# Patient Record
Sex: Female | Born: 1960
Health system: Southern US, Community
[De-identification: ages and names within clinical notes are randomized; demographics above are authoritative.]

## PROBLEM LIST (undated history)

## (undated) DIAGNOSIS — IMO0001 Reserved for inherently not codable concepts without codable children: Secondary | ICD-10-CM

## (undated) DIAGNOSIS — M797 Fibromyalgia: Secondary | ICD-10-CM

## (undated) DIAGNOSIS — Z95810 Presence of automatic (implantable) cardiac defibrillator: Secondary | ICD-10-CM

## (undated) DIAGNOSIS — K449 Diaphragmatic hernia without obstruction or gangrene: Secondary | ICD-10-CM

## (undated) DIAGNOSIS — I10 Essential (primary) hypertension: Secondary | ICD-10-CM

## (undated) DIAGNOSIS — L98499 Non-pressure chronic ulcer of skin of other sites with unspecified severity: Secondary | ICD-10-CM

## (undated) DIAGNOSIS — Z8619 Personal history of other infectious and parasitic diseases: Secondary | ICD-10-CM

## (undated) DIAGNOSIS — Z950 Presence of cardiac pacemaker: Secondary | ICD-10-CM

## (undated) DIAGNOSIS — F410 Panic disorder [episodic paroxysmal anxiety] without agoraphobia: Secondary | ICD-10-CM

## (undated) HISTORY — PX: KNEE ARTHROSCOPY: SUR90

## (undated) HISTORY — DX: Diaphragmatic hernia without obstruction or gangrene: K44.9

## (undated) HISTORY — DX: Personal history of other infectious and parasitic diseases: Z86.19

## (undated) HISTORY — DX: Essential (primary) hypertension: I10

## (undated) HISTORY — DX: Fibromyalgia: M79.7

## (undated) HISTORY — DX: Reserved for inherently not codable concepts without codable children: IMO0001

## (undated) HISTORY — DX: Presence of automatic (implantable) cardiac defibrillator: Z95.810

## (undated) HISTORY — DX: Panic disorder (episodic paroxysmal anxiety): F41.0

## (undated) HISTORY — DX: Presence of cardiac pacemaker: Z95.0

## (undated) HISTORY — DX: Non-pressure chronic ulcer of skin of other sites with unspecified severity: L98.499

---

## 1995-02-04 HISTORY — PX: TUBAL LIGATION: SHX77

## 1995-02-04 HISTORY — PX: CHOLECYSTECTOMY: SHX55

## 2000-06-28 ENCOUNTER — Emergency Department (HOSPITAL_COMMUNITY): Admission: EM | Admit: 2000-06-28 | Discharge: 2000-06-28 | Payer: Self-pay | Admitting: Emergency Medicine

## 2000-06-28 ENCOUNTER — Encounter: Payer: Self-pay | Admitting: Emergency Medicine

## 2000-07-10 ENCOUNTER — Emergency Department (HOSPITAL_COMMUNITY): Admission: EM | Admit: 2000-07-10 | Discharge: 2000-07-10 | Payer: Self-pay | Admitting: Internal Medicine

## 2000-07-10 ENCOUNTER — Encounter: Payer: Self-pay | Admitting: *Deleted

## 2006-04-14 ENCOUNTER — Ambulatory Visit (HOSPITAL_COMMUNITY): Admission: RE | Admit: 2006-04-14 | Discharge: 2006-04-14 | Payer: Self-pay | Admitting: Orthopaedic Surgery

## 2006-04-14 ENCOUNTER — Encounter (INDEPENDENT_AMBULATORY_CARE_PROVIDER_SITE_OTHER): Payer: Self-pay | Admitting: Specialist

## 2007-01-07 ENCOUNTER — Ambulatory Visit: Payer: Self-pay | Admitting: Orthopedic Surgery

## 2007-01-07 DIAGNOSIS — Z8679 Personal history of other diseases of the circulatory system: Secondary | ICD-10-CM | POA: Insufficient documentation

## 2007-01-07 DIAGNOSIS — M7660 Achilles tendinitis, unspecified leg: Secondary | ICD-10-CM | POA: Insufficient documentation

## 2007-01-08 ENCOUNTER — Telehealth: Payer: Self-pay | Admitting: Orthopedic Surgery

## 2007-04-12 ENCOUNTER — Ambulatory Visit (HOSPITAL_COMMUNITY): Admission: RE | Admit: 2007-04-12 | Discharge: 2007-04-12 | Payer: Self-pay | Admitting: Family Medicine

## 2007-04-22 ENCOUNTER — Telehealth: Payer: Self-pay | Admitting: Orthopedic Surgery

## 2010-06-21 NOTE — H&P (Signed)
April Robles, April Robles                ACCOUNT NO.:  [id number]   MEDICAL RECORD NO.:  [id number]          PATIENT TYPE:  AMB   LOCATION:  DAY                           FACILITY:  [facility]   PHYSICIAN:  J. Darreld Mclean, M.D. DATE OF BIRTH:  10-28-1960   DATE OF ADMISSION:  DATE OF DISCHARGE:  LH                              HISTORY & PHYSICAL   CHIEF COMPLAINT:  Right knee pain.   Patient seen by me the first time in the office on February 18, 2006.  She got hurt on the job on January 23, 2006 with a right knee injury.  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  There was a rug in front of the door.  She did  not realize it was there.  She got her foot caught on the rug, slipped,  fell and twisted, landed on her right knee.  She was seen at [facility] emergency room on the December 21st, the day of the injury.  She was told she had degenerative joint disease of the knee.  The knee  kept bothering her, kept swelling, kept giving way, kept having pain.  I  was concerned about a meniscal injury and arranged a MRI of her knee  done at [facility] imaging, on the 21st of January.  The MRI showed  synovial osteochondromatosis and a medial meniscal tear with effusion.  The lateral meniscus and ACL and MCL and the LCL were intact.  I  informed the patient of the findings on the 23rd of January.  She was  having less pain, because I injected her knee on the original date that  I saw her.  She wanted to hold off any surgery.  She began having more  pain in the knee, especially with weather changes on February 6th, and I  recommended surgery of the knee.  We discussed this on the 25th of  February, and she agreed to undergo the procedure for tomorrow, the 11th  of March.  Risks and imponderables of the procedure were discussed.  The  patient expressed understanding, agrees to procedure as outlined.   PAST MEDICAL HISTORY:  Patient has  history of hypertension and ulcer  disease.  She denies heart disease, lung disease, kidney disease,  stroke, paralysis, weakness, diabetes, cancer, circulatory problems.   ALLERGIES:  THE PATIENT HAS NO ALLERGIES.   CURRENT MEDICATIONS:  1. Prilosec OTC 20 mg daily.  2. Alprazolam 0.5 mg, 1/2 to 1 every 8 hours as needed.  3. Hydrochlorothiazide 25 mg daily.  4. Enalapril 10 mg daily.  5. Histinex daily.  6. Darvocet N-100.   SOCIAL HISTORY:  She smokes a pack a week, does not use alcoholic  beverages.  [facility] is her family doctor.   PAST SURGICAL HISTORY:  She is status post D&C in 1980, left knee  surgery 2001, bilateral tubal ligation 1997, gallbladder 1997.  She  denies any diseases that run in the family, as well as an on the job  injury.  She is single  and lives in [place].   PHYSICAL EXAMINATION:  VITAL SIGNS:  BP is 140/88, pulse 80, respiratory  rate 16, afebrile, 5 foot 10-1/4 inches, 261 pounds.  GENERAL:  She is alert, cooperative, oriented.  HEENT:  Negative.  NECK:  Supple.  LUNGS:  Clear to P&A.  HEART:  Regular rate and rhythm without murmur heard.  ABDOMEN:  Soft, tender without masses.  EXTREMITIES:  She has got a positive effusion of the right knee.  She  has got a positive McMurray's medially and pain and tenderness in the  right knee.  Range of motion of right knee is 0-100 and is stable.  Other extremities are within normal limits.  CNS:  Intact.  SKIN:  Intact.   IMPRESSION:  Tear, medial meniscus of the right knee.   PLAN:  Operative arthroscopy of the right knee, lab pending.                                            ______________________________  J. Darreld Mclean, M.D.     JWK/MEDQ  D:  04/13/2006  T:  04/13/2006  Job:  [id number]   cc:   Teola Bradley, M.D.  Fax: [phone]

## 2010-06-21 NOTE — Op Note (Signed)
NAMEZADIA, UHDE                ACCOUNT NO.:  [id number]   MEDICAL RECORD NO.:  [id number]          PATIENT TYPE:  AMB   LOCATION:  DAY                           FACILITY:  [facility]   PHYSICIAN:  J. Darreld Mclean, M.D. DATE OF BIRTH:  [birth year]   DATE OF PROCEDURE:  DATE OF DISCHARGE:                               OPERATIVE REPORT   PREOPERATIVE DIAGNOSIS:  Torn medial meniscus, right knee.   POSTOPERATIVE DIAGNOSIS:  Torn medial meniscus, right knee.   OPERATION PERFORMED:  Operative fluoroscopy to the right knee, partial  medial meniscectomy.   SURGEON:  J. Darreld Mclean, M.D.   TOURNIQUET TIME:  24 minutes.   DRAINS:  None.   ANESTHESIA:   INDICATIONS FOR PROCEDURE:  The patient is a 50 year old female  complaining of tenderness in her right knee.  She hurt it on the job.  She had pain and tenderness in her knee and getting progressively worse.  MRI shows tear of the medial meniscus.  She has not improved with  conservative treatment.  Surgery is recommended.  Risks and  imponderables of the procedure were discussed preoperatively and she  appeared to understand and agreed to procedure as outlined.   DESCRIPTION OF PROCEDURE:  The patient was seen in the holding area.  The right knee was identified as the correct surgical site.  She placed  a marker on the right knee.  I placed a marker on the right knee.  I  talked to the family.  The patient then brought back to the operating  room.  She was placed supine on the operating room table and given  general anesthetic.  With the tourniquet deflated and a leg holder  applied.  She was prepped and draped in usual manner.  We had a time  out, identifying Ms. Caudle as the patient and the right knee as  correct surgical site.   Leg wrapped circumferentially with Esmarch bandage, tourniquet inflated  to 300 mmHg.  Esmarch bandage removed.  Inflow cannula inserted  medially, lactated ringers instilled in the knee by an  infusion pump.  Knee systematically examined.   FINDINGS:  Suprapatellar pouch had synovitis.  There was a small spur  off the lateral side of the patella.  There were some grade 2 changes of  the patella.  Medially there were grade 3 changes and one area of grade  4 changes in the distal femoral condyle and grade 3 changes in the  tibial plateau.  She has a tear on the posterior horn of the meniscus.  This was stellate type tear.  Anterior cruciate ligament was intact  laterally, looked very good.  There were grade 2 changes.  The meniscus  looked normal.  Permanent pictures taken throughout the procedure.  Attention was directed back to the medial side and using a meniscal  shaver, a punch and then a holmium laser, a good smooth contour was  obtained.  Knee was systematically examined and no pathology found.  Wound was irrigated with remaining part of lactated Ringers.  Wound was  reapproximated using 3-0 nylon interrupted vertical mattress  manner.  0.25% Marcaine was used each portal.  Tourniquet deflated at 24 minutes.  Sterile dressing applied, bulky dressing applied.  The patient tolerated  the procedure well, will go to recovery in good condition.  He has  Darvocet-N 100 for pain.  I will see him in office in approximately 10  days to two weeks.  Physical therapy has been arranged.  He is to  contact the office with any difficulty.  Numbers have been provided.           ______________________________  Shela Commons. Darreld Mclean, M.D.     JWK/MEDQ  D:  04/14/2006  T:  04/14/2006  Job:  [id number]

## 2011-02-16 DIAGNOSIS — M79609 Pain in unspecified limb: Secondary | ICD-10-CM | POA: Diagnosis not present

## 2011-04-09 DIAGNOSIS — G8929 Other chronic pain: Secondary | ICD-10-CM | POA: Diagnosis not present

## 2011-04-09 DIAGNOSIS — K219 Gastro-esophageal reflux disease without esophagitis: Secondary | ICD-10-CM | POA: Diagnosis not present

## 2011-04-09 DIAGNOSIS — D649 Anemia, unspecified: Secondary | ICD-10-CM | POA: Diagnosis not present

## 2011-04-09 DIAGNOSIS — I10 Essential (primary) hypertension: Secondary | ICD-10-CM | POA: Diagnosis not present

## 2011-04-09 DIAGNOSIS — J069 Acute upper respiratory infection, unspecified: Secondary | ICD-10-CM | POA: Diagnosis not present

## 2011-04-23 DIAGNOSIS — R55 Syncope and collapse: Secondary | ICD-10-CM | POA: Diagnosis not present

## 2011-04-23 DIAGNOSIS — F17200 Nicotine dependence, unspecified, uncomplicated: Secondary | ICD-10-CM | POA: Diagnosis not present

## 2011-04-23 DIAGNOSIS — Z4509 Encounter for adjustment and management of other cardiac device: Secondary | ICD-10-CM | POA: Diagnosis not present

## 2011-04-23 DIAGNOSIS — I441 Atrioventricular block, second degree: Secondary | ICD-10-CM | POA: Diagnosis not present

## 2011-04-23 DIAGNOSIS — Z888 Allergy status to other drugs, medicaments and biological substances status: Secondary | ICD-10-CM | POA: Diagnosis not present

## 2011-04-23 DIAGNOSIS — E785 Hyperlipidemia, unspecified: Secondary | ICD-10-CM | POA: Diagnosis not present

## 2011-04-23 DIAGNOSIS — K209 Esophagitis, unspecified without bleeding: Secondary | ICD-10-CM | POA: Diagnosis not present

## 2011-04-23 DIAGNOSIS — I10 Essential (primary) hypertension: Secondary | ICD-10-CM | POA: Diagnosis not present

## 2011-04-23 DIAGNOSIS — F411 Generalized anxiety disorder: Secondary | ICD-10-CM | POA: Diagnosis not present

## 2011-05-05 DIAGNOSIS — Z950 Presence of cardiac pacemaker: Secondary | ICD-10-CM

## 2011-05-05 DIAGNOSIS — Z95810 Presence of automatic (implantable) cardiac defibrillator: Secondary | ICD-10-CM

## 2011-05-05 HISTORY — PX: OTHER SURGICAL HISTORY: SHX169

## 2011-05-05 HISTORY — DX: Presence of cardiac pacemaker: Z95.0

## 2011-05-05 HISTORY — DX: Presence of automatic (implantable) cardiac defibrillator: Z95.810

## 2011-06-02 DIAGNOSIS — I10 Essential (primary) hypertension: Secondary | ICD-10-CM | POA: Diagnosis not present

## 2011-06-02 DIAGNOSIS — R0602 Shortness of breath: Secondary | ICD-10-CM | POA: Diagnosis not present

## 2011-06-02 DIAGNOSIS — N912 Amenorrhea, unspecified: Secondary | ICD-10-CM | POA: Diagnosis not present

## 2011-06-02 DIAGNOSIS — R079 Chest pain, unspecified: Secondary | ICD-10-CM | POA: Diagnosis not present

## 2011-06-02 DIAGNOSIS — F411 Generalized anxiety disorder: Secondary | ICD-10-CM | POA: Diagnosis not present

## 2011-06-02 DIAGNOSIS — R002 Palpitations: Secondary | ICD-10-CM | POA: Diagnosis not present

## 2011-06-02 DIAGNOSIS — F17200 Nicotine dependence, unspecified, uncomplicated: Secondary | ICD-10-CM | POA: Diagnosis not present

## 2011-06-02 DIAGNOSIS — J984 Other disorders of lung: Secondary | ICD-10-CM | POA: Diagnosis not present

## 2011-06-02 DIAGNOSIS — R0789 Other chest pain: Secondary | ICD-10-CM | POA: Diagnosis not present

## 2011-06-02 DIAGNOSIS — R42 Dizziness and giddiness: Secondary | ICD-10-CM | POA: Diagnosis not present

## 2011-06-02 DIAGNOSIS — Z79899 Other long term (current) drug therapy: Secondary | ICD-10-CM | POA: Diagnosis not present

## 2011-08-06 DIAGNOSIS — IMO0002 Reserved for concepts with insufficient information to code with codable children: Secondary | ICD-10-CM | POA: Diagnosis not present

## 2011-08-06 DIAGNOSIS — Z95810 Presence of automatic (implantable) cardiac defibrillator: Secondary | ICD-10-CM | POA: Diagnosis not present

## 2011-08-06 DIAGNOSIS — I10 Essential (primary) hypertension: Secondary | ICD-10-CM | POA: Diagnosis not present

## 2011-08-06 DIAGNOSIS — M79609 Pain in unspecified limb: Secondary | ICD-10-CM | POA: Diagnosis not present

## 2011-08-06 DIAGNOSIS — M25869 Other specified joint disorders, unspecified knee: Secondary | ICD-10-CM | POA: Diagnosis not present

## 2011-08-06 DIAGNOSIS — Z79899 Other long term (current) drug therapy: Secondary | ICD-10-CM | POA: Diagnosis not present

## 2011-08-06 DIAGNOSIS — M1710 Unilateral primary osteoarthritis, unspecified knee: Secondary | ICD-10-CM | POA: Diagnosis not present

## 2011-08-06 DIAGNOSIS — F17200 Nicotine dependence, unspecified, uncomplicated: Secondary | ICD-10-CM | POA: Diagnosis not present

## 2011-10-02 DIAGNOSIS — Z6841 Body Mass Index (BMI) 40.0 and over, adult: Secondary | ICD-10-CM | POA: Diagnosis not present

## 2011-10-02 DIAGNOSIS — I10 Essential (primary) hypertension: Secondary | ICD-10-CM | POA: Diagnosis not present

## 2011-10-02 DIAGNOSIS — D649 Anemia, unspecified: Secondary | ICD-10-CM | POA: Diagnosis not present

## 2011-10-02 DIAGNOSIS — G8929 Other chronic pain: Secondary | ICD-10-CM | POA: Diagnosis not present

## 2011-10-02 DIAGNOSIS — F411 Generalized anxiety disorder: Secondary | ICD-10-CM | POA: Diagnosis not present

## 2011-10-02 LAB — CBC WITH DIFFERENTIAL/PLATELET: WBC: 4

## 2011-10-30 ENCOUNTER — Telehealth: Payer: Self-pay

## 2011-11-05 ENCOUNTER — Other Ambulatory Visit: Payer: Self-pay | Admitting: Gastroenterology

## 2011-11-05 ENCOUNTER — Ambulatory Visit (INDEPENDENT_AMBULATORY_CARE_PROVIDER_SITE_OTHER): Payer: Medicare Other | Admitting: Urgent Care

## 2011-11-05 ENCOUNTER — Encounter: Payer: Self-pay | Admitting: Urgent Care

## 2011-11-05 VITALS — BP 148/89 | HR 94 | Temp 98.4°F | Ht 70.0 in | Wt 280.0 lb

## 2011-11-05 DIAGNOSIS — D518 Other vitamin B12 deficiency anemias: Secondary | ICD-10-CM

## 2011-11-05 DIAGNOSIS — D649 Anemia, unspecified: Secondary | ICD-10-CM

## 2011-11-05 MED ORDER — PEG 3350-KCL-NA BICARB-NACL 420 G PO SOLR: 4000.0000 mL | ORAL | Status: DC

## 2011-11-05 NOTE — Assessment & Plan Note (Addendum)
April Robles is a pleasant 51 y.o. female recently diagnosed with anemia. Hemoglobin 10.9. No gross GI bleeding or any GI symptoms at this time. No history of heavy menses.  Hemoccult negative on exam today. She needs colonoscopy to be arranged with Dr. Darrick Penna.  We will Hemoccult stools to look for a occult GI bleeding.  Anemia panel to look for iron deficiency vs other causes of anemia.  I have discussed risks & benefits which include, but are not limited to, bleeding, infection, perforation & drug reaction.  The patient agrees with this plan & written consent will be obtained.  She has a defibrillator in place an Endoscopy will be made aware.  Anemia panel Hemoccult stools x3 If positive, consider EGD at the same time of colonoscopy Hold iron for one week prior to colonoscopy

## 2011-11-05 NOTE — Progress Notes (Signed)
Referring Provider: Landry Mellow, PA Primary Care Physician:  Kirk Ruths, MD Primary Gastroenterologist:  Dr. Jonette Eva  Chief Complaint  Patient presents with  . Colonoscopy    anemia    HPI:  April Robles is a 51 y.o. female here as a referral from Dr. Regino Schultze for colonoscopy.  Upon further review of pt's records, it was noted that she has anemia & is on iron as well as narcotics, therefore she was asked to come in for a visit to discuss prior to colonoscopy.  She takes 1-2 hydrocodone a couple times per week.  She also takes Xanax prn couple times per month for leg cramps & anxiety attacks.  Landry Mellow, PA started her on iron yesterday for anemia.  Hgb was  10.9, HCT 31.6, otherwise normal CBC.  She denies any rectal bleeding or melena. She denies heavy menses. Last menstrual period was one week ago. She generally has cycles that last 4 days. She has noticed some lower extremity edema.   Denies any lower GI symptoms including constipation, diarrhea, rectal bleeding, melena or weight loss.   Denies any upper GI symptoms including heartburn, indigestion, nausea, vomiting, dysphagia, odynophagia or anorexia.  She is on Prilosec 20 mg daily with history of GERD and remote peptic ulcer disease. She denies any NSAIDs but was previously on prednisone and meloxicam now discontinued.   Past Medical History  Diagnosis Date  . Hypertension   . Ulcer disease     remote in 78s  . Hiatal hernia   . History of shingles   . Panic attack   . Automatic implantable cardiac defibrillator in situ 05/2011    2nd (since 2010)  . Pacemaker 05/2011  . Fibromyalgia     Past Surgical History  Procedure Date  . Defibrillator/pacemaker 05/2011    replaced old one  . Tubal ligation 1997  . Cholecystectomy 1997  . Knee arthroscopy     both knees    Current Outpatient Prescriptions  Medication Sig Dispense Refill  . acyclovir (ZOVIRAX) 800 MG tablet Take 800 mg by mouth as needed.      . ALPRAZolam  (XANAX) 0.5 MG tablet Take 0.5 mg by mouth at bedtime as needed.       . ferrous sulfate 325 (65 FE) MG tablet Take 325 mg by mouth daily with breakfast.      . furosemide (LASIX) 20 MG tablet Take 20 mg by mouth as needed.      Marland Kitchen HYDROcodone-acetaminophen (NORCO) 7.5-325 MG per tablet Take 1 tablet by mouth every 6 (six) hours as needed.       Marland Kitchen losartan (COZAAR) 50 MG tablet Take 50 mg by mouth daily.       Marland Kitchen omeprazole (PRILOSEC) 20 MG capsule Take 20 mg by mouth daily.       . polyethylene glycol-electrolytes (TRILYTE) 420 G solution Take 4,000 mLs by mouth as directed.  4000 mL  0    Allergies as of 11/05/2011 - Review Complete 11/05/2011  Allergen Reaction Noted  . Cymbalta (duloxetine hcl) Palpitations 11/05/2011    Family History:There is no known family history of colorectal carcinoma , liver disease, or inflammatory bowel disease.  Problem Relation Age of Onset  . Alcohol abuse Father   . Coronary artery disease Father   . Diabetes Father   . Stroke Mother   . Hypertension Sister     History   Social History  . Marital Status: Single    Spouse Name: N/A  Number of Children: 3  . Years of Education: N/A   Occupational History  . disabled    Social History Main Topics  . Smoking status: Current Some Day Smoker -- 0.2 packs/day for 20 years    Types: Cigarettes  . Smokeless tobacco: Not on file  . Alcohol Use: No  . Drug Use: No  . Sexually Active: Not on file   Other Topics Concern  . Not on file   Social History Narrative   Lives w/ daughter    Review of Systems: Gen: Denies any fever, chills, sweats, anorexia, fatigue, weakness, malaise, weight loss, and sleep disorder CV: Denies chest pain, angina, palpitations, syncope, orthopnea, PND, peripheral edema, and claudication. Resp: Denies dyspnea at rest, dyspnea with exercise, cough, sputum, wheezing, coughing up blood, and pleurisy. +freq throat clearing. GI: Denies vomiting blood, jaundice, and fecal  incontinence.   Denies dysphagia or odynophagia. GU : Denies urinary burning, blood in urine, urinary frequency, urinary hesitancy, nocturnal urination, and urinary incontinence. MS: c/o joint pains, knees. Derm: Denies rash, itching, dry skin, hives, moles, warts, or unhealing ulcers.  Psych: +anxiety & panic attacks Heme: Denies bruising, bleeding, and enlarged lymph nodes. Neuro:  Denies any headaches, dizziness, paresthesias. Endo:  Denies any problems with DM, thyroid, adrenal function.  Physical Exam: BP 148/89  Pulse 94  Temp 98.4 F (36.9 C) (Temporal)  Ht 5\' 10"  (1.778 m)  Wt 280 lb (127.007 kg)  BMI 40.18 kg/m2  LMP 10/28/2011 Patient's last menstrual period was 10/28/2011. General:   Alert,  Well-developed, obese, pleasant and cooperative in NAD Head:  Normocephalic and atraumatic. Eyes:  Sclera clear, no icterus.   Conjunctiva pink. Ears:  Normal auditory acuity. Nose:  No deformity, discharge, or lesions. Mouth:  No deformity or lesions,oropharynx pink & moist. Neck:  Supple; no masses or thyromegaly. Lungs:  Clear throughout to auscultation.   No wheezes, crackles, or rhonchi. No acute distress. Heart:  Regular rate and rhythm; no murmurs, clicks, rubs,  or gallops. +AICD left upper ant chest Abdomen:  Protuberant.  Normal bowel sounds.  No bruits.  Soft, non-tender and non-distended without masses, hepatosplenomegaly or hernias noted.  No guarding or rebound tenderness.  Exam limited given body habitus. Rectal:  Hemoccult-negative stool was obtained from the vault. No internal or external lesions visualized palpated. Msk:  Symmetrical without gross deformities. Pulses:  Normal pulses noted. Extremities:  No clubbing.  1+ LEE bilat Neurologic:  Alert and oriented x4;  grossly normal neurologically. Skin:  Intact without significant lesions or rashes. Lymph Nodes:  No significant cervical adenopathy. Psych:  Alert and cooperative. Normal mood and affect.

## 2011-11-05 NOTE — Progress Notes (Signed)
Faxed to PCP

## 2011-11-05 NOTE — Patient Instructions (Addendum)
Return stool cards as soon as possible Colonoscopy with Dr Darrick Penna Hold iron for 7 days before your colonoscopy We have requested your labs from [facility] You may need more labwork.  We will call you once it is reviewed.

## 2011-11-06 NOTE — Progress Notes (Signed)
Quick Note:  Reviewed at OV ______ 

## 2011-11-06 NOTE — Progress Notes (Signed)
Lab cancelled anemia panel. Please find out why? Thanks

## 2011-11-06 NOTE — Progress Notes (Signed)
[employer]. Per Aram Beecham, phlebotomist said blood was drawn, but the lab never received it. Pt can come in and have done again. She will not be charged but once. I do not have to send new orders. I tried to call pt. LMOM at home to call. Mobile number said it was not reachable. Called work number and was told that they did not have anyone there by that number.

## 2011-11-07 LAB — FERRITIN: Ferritin: 16 ng/mL (ref 10–291)

## 2011-11-07 LAB — FOLATE: Folate: 6.8 ng/mL

## 2011-11-07 NOTE — Telephone Encounter (Signed)
Had OV with Lorenza Burton, NP on 11/04/2011.

## 2011-11-07 NOTE — Telephone Encounter (Signed)
OV was 10/2/ 2013.

## 2011-11-07 NOTE — Progress Notes (Signed)
I called pt and she got the message yesterday and is going this AM to get blood redrawn.

## 2011-11-12 ENCOUNTER — Encounter (HOSPITAL_COMMUNITY): Payer: Self-pay | Admitting: Pharmacy Technician

## 2011-11-12 ENCOUNTER — Ambulatory Visit: Payer: Medicare Other | Admitting: Urgent Care

## 2011-11-12 DIAGNOSIS — D649 Anemia, unspecified: Secondary | ICD-10-CM

## 2011-11-12 LAB — POC HEMOCCULT BLD/STL (HOME/3-CARD/SCREEN): Card #2 Fecal Occult Blod, POC: NEGATIVE

## 2011-11-12 NOTE — Progress Notes (Signed)
Quick Note:  Did pt return hemoccults? Please let her know that her iron is low. Return hemoccults asap if not already done. Thanks WU:JWJXBJY,NWGNFAO M, MD  ______

## 2011-11-12 NOTE — Progress Notes (Signed)
Quick Note:  I called and informed pt. She said she mailed the hemoccult cards yesterday. She said she already knew that her iron was low, but she does not take the iron pills. Said if she did, she wouldn't have been able to do anything to put on the hemoccult cards. ______

## 2011-11-13 NOTE — Progress Notes (Signed)
Quick Note:  Please let pt know. No blood in stools. Continue plan of care discussed at OV. Thanks WU:JWJXBJY,NWGNFAO M, MD  ______

## 2011-11-13 NOTE — Progress Notes (Signed)
Quick Note:  Pt informed ______ 

## 2011-11-14 ENCOUNTER — Encounter (HOSPITAL_COMMUNITY): Payer: Self-pay | Admitting: Pharmacy Technician

## 2011-11-17 ENCOUNTER — Encounter (HOSPITAL_COMMUNITY)
Admission: RE | Disposition: A | Discharge status: Home / Self Care | Payer: Self-pay | Source: Ambulatory Visit | Attending: Gastroenterology

## 2011-11-17 ENCOUNTER — Encounter (HOSPITAL_COMMUNITY): Payer: Self-pay | Admitting: *Deleted

## 2011-11-17 ENCOUNTER — Ambulatory Visit (HOSPITAL_COMMUNITY)
Admission: RE | Admit: 2011-11-17 | Discharge: 2011-11-17 | Disposition: A | Discharge status: Home / Self Care | Payer: Medicare Other | Source: Ambulatory Visit | Attending: Gastroenterology | Admitting: Gastroenterology

## 2011-11-17 DIAGNOSIS — I10 Essential (primary) hypertension: Secondary | ICD-10-CM | POA: Diagnosis not present

## 2011-11-17 DIAGNOSIS — D128 Benign neoplasm of rectum: Secondary | ICD-10-CM | POA: Insufficient documentation

## 2011-11-17 DIAGNOSIS — D649 Anemia, unspecified: Secondary | ICD-10-CM

## 2011-11-17 DIAGNOSIS — Z95810 Presence of automatic (implantable) cardiac defibrillator: Secondary | ICD-10-CM | POA: Diagnosis not present

## 2011-11-17 DIAGNOSIS — K2990 Gastroduodenitis, unspecified, without bleeding: Secondary | ICD-10-CM

## 2011-11-17 DIAGNOSIS — K5730 Diverticulosis of large intestine without perforation or abscess without bleeding: Secondary | ICD-10-CM | POA: Insufficient documentation

## 2011-11-17 DIAGNOSIS — K2970 Gastritis, unspecified, without bleeding: Secondary | ICD-10-CM

## 2011-11-17 DIAGNOSIS — K648 Other hemorrhoids: Secondary | ICD-10-CM | POA: Diagnosis not present

## 2011-11-17 DIAGNOSIS — K2940 Chronic atrophic gastritis without bleeding: Secondary | ICD-10-CM | POA: Diagnosis not present

## 2011-11-17 DIAGNOSIS — D126 Benign neoplasm of colon, unspecified: Secondary | ICD-10-CM | POA: Insufficient documentation

## 2011-11-17 HISTORY — PX: ESOPHAGOGASTRODUODENOSCOPY: SHX5428

## 2011-11-17 HISTORY — PX: COLONOSCOPY: SHX5424

## 2011-11-17 SURGERY — COLONOSCOPY
Anesthesia: Moderate Sedation

## 2011-11-17 MED ORDER — MIDAZOLAM HCL 5 MG/5ML IJ SOLN
INTRAMUSCULAR | Status: AC
  Filled 2011-11-17: qty 10

## 2011-11-17 MED ORDER — MIDAZOLAM HCL 5 MG/5ML IJ SOLN
INTRAMUSCULAR | Status: DC | PRN
  Administered 2011-11-17: 1 mg via INTRAVENOUS
  Administered 2011-11-17: 2 mg via INTRAVENOUS
  Administered 2011-11-17 (×3): 1 mg via INTRAVENOUS

## 2011-11-17 MED ORDER — MEPERIDINE HCL 100 MG/ML IJ SOLN
INTRAMUSCULAR | Status: DC | PRN
  Administered 2011-11-17 (×2): 25 mg via INTRAVENOUS
  Administered 2011-11-17: 50 mg via INTRAVENOUS
  Administered 2011-11-17: 25 mg via INTRAVENOUS

## 2011-11-17 MED ORDER — MEPERIDINE HCL 100 MG/ML IJ SOLN
INTRAMUSCULAR | Status: AC
  Filled 2011-11-17: qty 1

## 2011-11-17 MED ORDER — MEPERIDINE HCL 100 MG/ML IJ SOLN
INTRAMUSCULAR | Status: AC
  Filled 2011-11-17: qty 2

## 2011-11-17 MED ORDER — STERILE WATER FOR IRRIGATION IR SOLN
Status: DC | PRN
  Administered 2011-11-17: 13:00:00

## 2011-11-17 MED ORDER — PROMETHAZINE HCL 25 MG/ML IJ SOLN
INTRAMUSCULAR | Status: AC
  Filled 2011-11-17: qty 1

## 2011-11-17 MED ORDER — SODIUM CHLORIDE 0.9 % IJ SOLN
INTRAMUSCULAR | Status: AC
  Filled 2011-11-17: qty 10

## 2011-11-17 MED ORDER — PROMETHAZINE HCL 25 MG/ML IJ SOLN
INTRAMUSCULAR | Status: DC | PRN
  Administered 2011-11-17: 12.5 mg via INTRAVENOUS

## 2011-11-17 MED ORDER — SODIUM CHLORIDE 0.45 % IV SOLN
INTRAVENOUS | Status: DC
  Administered 2011-11-17: 12:00:00 via INTRAVENOUS

## 2011-11-17 NOTE — Op Note (Signed)
[facility] [address] [place], [address]   COLONOSCOPY PROCEDURE REPORT  PATIENT: April Robles, April Robles  MR#: [id number] BIRTHDATE: 08-20-1960 , 50  yrs. old GENDER: Female ENDOSCOPIST: Jonette Eva, MD REFERRED XB:JYNWGNF Regino Schultze, M.D. PROCEDURE DATE:  11/17/2011 PROCEDURE:   ileoColonoscopy with biopsy and Colonoscopy with snare polypectomy INDICATIONS:anemia, non-specific. MEDICATIONS: Demerol 100 mg IV, Versed 4 mg IV, and Promethazine (Phenergan) 12.5mg  IV  DESCRIPTION OF PROCEDURE:    Physical exam was performed.  Informed consent was obtained from the patient after explaining the benefits, risks, and alternatives to procedure.  The patient was connected to monitor and placed in left lateral position. Continuous oxygen was provided by nasal cannula and IV medicine administered through an indwelling cannula.  After administration of sedation and rectal exam, the patients rectum was intubated and the Pentax Colonoscope [phone]  colonoscope was advanced under direct visualization to the ileum.  The scope was removed slowly by carefully examining the color, texture, anatomy, and integrity mucosa on the way out.  The patient was recovered in endoscopy and discharged home in satisfactory condition.       COLON FINDINGS: Five sessile polyps ranging between 3-58mm in size were found in the ascending colon, at the cecum, in the descending colon, and sigmoid colon.  A polypectomy was performed with cold forceps and using snare cautery.  , Mild diverticulosis was noted in the descending colon.  , The colon mucosa was otherwise normal. , The mucosa appeared normal in the terminal ileum.   20 CM VISUALIZED. and Moderate sized internal hemorrhoids were found. REDUNDNT TRANSVERSE/SIGMOID COLON  PREP QUALITY: good. CECAL W/D TIME: 27 minutes  COMPLICATIONS: None  ENDOSCOPIC IMPRESSION: 1.   Five sessile polyps ranging between 3-51mm in size were found in the  ascending colon, at the cecum, in the descending colon, and sigmoid colon; polypectomy was performed with cold forceps and using snare cautery 2.   Mild diverticulosis was noted in the descending colon 3.   The colon mucosa was otherwise normal 4.   Normal mucosa in the terminal ileum 5.   Moderate sized internal hemorrhoids   RECOMMENDATIONS: FOLLOW A HIGH FIBER/LOW FAT DIET.  AVOID ITEMS THAT CAUSE BLOATING.   DRINK ENOUGH WATER TO KEEP YOUR URINE LIGHT YELLOW.  LOSE 10 TO 20 LBS.  BIOPSY RESULTS WILL BE BACK IN 7 DAYS.  FOLLOW UP IN 4 MOS.  NEXT COLONOSCOPY IN 10 YEARS WITH PROPOFOL/OVERTUBE.       _______________________________ Rosalie DoctorJonette Eva, MD 11/17/2011 4:16 PM     PATIENT NAME:  April Robles, April Robles MR#: [id number]

## 2011-11-17 NOTE — Op Note (Signed)
[facility] [address] [place], [address]   ENDOSCOPY PROCEDURE REPORT  PATIENT: April Robles, April Robles  MR#: [id number] BIRTHDATE: 08-28-1960 , 50  yrs. old GENDER: Female  ENDOSCOPIST: Jonette Eva, MD REFERRED XB:JYNWGNF Regino Schultze, M.D.  PROCEDURE DATE: 11/17/2011 PROCEDURE:   EGD w/ biopsy  INDICATIONS:anemia. PT TOLD SHE WAS ANEMIA YEARS AGO. STILL HAS  A QMO CYCLE WITH 3 DAYS OF HEAVY BLEEDING.  MEDICATIONS: TCS + Demerol 25 mg IV and Versed 2 mg IV TOPICAL ANESTHETIC:   Cetacaine Spray  DESCRIPTION OF PROCEDURE:     Physical exam was performed.  Informed consent was obtained from the patient after explaining the benefits, risks, and alternatives to the procedure.  The patient was connected to the monitor and placed in the left lateral position.  Continuous oxygen was provided by nasal cannula and IV medicine administered through an indwelling cannula.  After administration of sedation, the patients esophagus was intubated and the Pentax Gastroscope [id number]  endoscope was advanced under direct visualization to the second portion of the duodenum.  The scope was removed slowly by carefully examining the color, texture, anatomy, and integrity of the mucosa on the way out.  The patient was recovered in endoscopy and discharged home in satisfactory condition.      ESOPHAGUS: The mucosa of the esophagus appeared normal.  STOMACH: Non-erosive gastritis (inflammation) was found.  Multiple biopsies were performed.  DUODENUM: The duodenal mucosa showed no abnormalities.  Cold forcep biopsies were taken in the second portion. COMPLICATIONS:   None  ENDOSCOPIC IMPRESSION: 1.   The mucosa of the esophagus appeared normal 2.   Non-erosive gastritis (inflammation) was found; multiple biopsies 3.   The duodenal mucosa showed no abnormalities 4.  NO OBVIOUS SOURCE FOR ANEMIA IDENTIFIED  RECOMMENDATIONS: CONTINUE OMEPRAZOLE 30 MINUTES PRIOR TO MEALS ONCE DAILY  FOREVER.  AVOID ITEMS THAT TRIGGER GASTRITIS.  FOLLOW A HIGH FIBER/LOW FAT DIET.  AVOID ITEMS THAT CAUSE BLOATING.   DRINK ENOUGH WATER TO KEEP YOUR URINE LIGHT YELLOW.  LOSE 10 TO 20 LBS.  BIOPSY RESULTS WILL BE BACK IN 7 DAYS.  FOLLOW UP IN 4 MOS.  NEXT COLONOSCOPY IN 10 YEARS.   REPEAT EXAM:   _______________________________ Rosalie DoctorJonette Eva, MD 11/17/2011 3:17 PM       PATIENT NAME:  April Robles, April Robles MR#: [id number]

## 2011-11-17 NOTE — H&P (Signed)
  Primary Care Physician:  Kirk Ruths, MD Primary Gastroenterologist:  Dr. Darrick Penna  Pre-Procedure History & Physical: HPI:  April Robles is a 51 y.o. female here for  ANEMIA/HEME NEGX3.   Past Medical History  Diagnosis Date  . Hypertension   . Ulcer disease     remote in 36s  . Hiatal hernia   . History of shingles   . Panic attack   . Automatic implantable cardiac defibrillator in situ 05/2011    2nd (since 2010)  . Fibromyalgia   . Pacemaker 05/2011  . Automatic implantable cardiac defibrillator in situ     Past Surgical History  Procedure Date  . Defibrillator/pacemaker 05/2011    replaced old one  . Tubal ligation 1997  . Cholecystectomy 1997  . Knee arthroscopy     both knees    Prior to Admission medications   Medication Sig Start Date End Date Taking? Authorizing Provider  acyclovir (ZOVIRAX) 800 MG tablet Take 800 mg by mouth daily.    Yes Historical Provider, MD  ALPRAZolam Prudy Feeler) 0.5 MG tablet Take 0.5 mg by mouth at bedtime as needed. anxiety 11/01/11  Yes Historical Provider, MD  furosemide (LASIX) 20 MG tablet Take 20 mg by mouth daily as needed. Fluid retention   Yes Historical Provider, MD  HYDROcodone-acetaminophen (NORCO) 7.5-325 MG per tablet Take 1 tablet by mouth every 6 (six) hours as needed. Pain 11/01/11  Yes Historical Provider, MD  losartan (COZAAR) 50 MG tablet Take 50 mg by mouth daily.  11/01/11  Yes Historical Provider, MD  omeprazole (PRILOSEC) 20 MG capsule Take 20 mg by mouth daily.  11/01/11  Yes Historical Provider, MD    Allergies as of 11/05/2011 - Review Complete 11/05/2011  Allergen Reaction Noted  . Cymbalta (duloxetine hcl) Palpitations 11/05/2011    Family History  Problem Relation Age of Onset  . Alcohol abuse Father   . Coronary artery disease Father   . Diabetes Father   . Stroke Mother   . Hypertension Sister   . Colon cancer Neg Hx     History   Social History  . Marital Status: Single    Spouse Name: N/A    Number of Children: 3  . Years of Education: N/A   Occupational History  . disabled    Social History Main Topics  . Smoking status: Current Every Day Smoker -- 0.2 packs/day for 20 years    Types: Cigarettes  . Smokeless tobacco: Not on file  . Alcohol Use: No  . Drug Use: No  . Sexually Active: Not on file   Other Topics Concern  . Not on file   Social History Narrative   Lives w/ daughter    Review of Systems: See HPI, otherwise negative ROS   Physical Exam: LMP 11/16/2011 General:   Alert,  pleasant and cooperative in NAD Head:  Normocephalic and atraumatic. Neck:  Supple; Lungs:  Clear throughout to auscultation.    Heart:  Regular rate and rhythm. Abdomen:  Soft, nontender and nondistended. Normal bowel sounds, without guarding, and without rebound.   Neurologic:  Alert and  oriented x4;  grossly normal neurologically.  Impression/Plan:     Anemia/HEME NEGx3  PLAN:  1. TCS/?EGD TODAY

## 2011-11-21 ENCOUNTER — Encounter (HOSPITAL_COMMUNITY): Payer: Self-pay | Admitting: Gastroenterology

## 2011-11-21 ENCOUNTER — Telehealth: Payer: Self-pay | Admitting: Gastroenterology

## 2011-11-21 ENCOUNTER — Other Ambulatory Visit: Payer: Self-pay | Admitting: Gastroenterology

## 2011-11-21 DIAGNOSIS — K922 Gastrointestinal hemorrhage, unspecified: Secondary | ICD-10-CM

## 2011-11-21 DIAGNOSIS — D649 Anemia, unspecified: Secondary | ICD-10-CM

## 2011-11-21 NOTE — Telephone Encounter (Signed)
REVIEWED.  

## 2011-11-21 NOTE — Telephone Encounter (Signed)
Path faxed to PCP, recalls made 

## 2011-11-21 NOTE — Telephone Encounter (Signed)
Please call pt. She had a simple adenoma removed from her colon. HER stomach Bx shows gastritis.  HER SMALL BOWEL BIOPSIES ARE NORMAL. NO SOURCE FOR YOUR ANEMIA HAS BE IDENTIFIED. YOU SHOULD HAVE A CAPSULE ENDOSCOPY TO COMPLETE YOUR WORK UP, Dx: OBSCURE GI BLEED/ANEMIA.   CONTINUE OMEPRAZOLE 30 MINUTES PRIOR TO MEALS ONCE DAILY FOREVER.  AVOID ITEMS THAT TRIGGER GASTRITIS.   FOLLOW A HIGH FIBER/LOW FAT DIET. AVOID ITEMS THAT CAUSE BLOATING.   DRINK ENOUGH WATER TO KEEP YOUR URINE LIGHT YELLOW.  LOSE 10 TO 20 LBS.   FOLLOW UP IN 4 MOS.  NEXT COLONOSCOPY IN 10 YEARS.

## 2011-11-21 NOTE — Telephone Encounter (Signed)
Patient is scheduled for a GIVENS on Monday Oct 28th at instructions have been mailed and patient is aware

## 2011-11-21 NOTE — Telephone Encounter (Signed)
Pt aware of results.  Leigh-ann can you please get her set up for the capsule endoscopy:DX-obscure GI BLEED/ANEMIA  Dawn can please NIC for 4 month follow up and 10 years TCS

## 2011-11-26 ENCOUNTER — Encounter (HOSPITAL_COMMUNITY): Payer: Self-pay | Admitting: Pharmacy Technician

## 2011-12-01 ENCOUNTER — Encounter (HOSPITAL_COMMUNITY): Admission: RE | Payer: Self-pay | Source: Ambulatory Visit

## 2011-12-01 ENCOUNTER — Telehealth: Payer: Self-pay

## 2011-12-01 ENCOUNTER — Ambulatory Visit (HOSPITAL_COMMUNITY): Admission: RE | Admit: 2011-12-01 | Payer: Medicare Other | Source: Ambulatory Visit | Admitting: Gastroenterology

## 2011-12-01 SURGERY — IMAGING PROCEDURE, GI TRACT, INTRALUMINAL, VIA CAPSULE

## 2011-12-01 NOTE — Telephone Encounter (Signed)
Melanie called and stated Ms. 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

## 2011-12-01 NOTE — Telephone Encounter (Signed)
Pt called this morning to let us know she did not have her test done today because she did not get her instruction until Saturday and there was not time for her to do what she needed to do. She will call back this afternoon to reschedule.

## 2011-12-01 NOTE — Progress Notes (Signed)
No show for Givens Capsule study. Called pt at home. States she could not "get everything ready for procedure today". States she will call office and reschedule. I called office to inform them.

## 2011-12-13 NOTE — Telephone Encounter (Signed)
CONTACT PT TO [facility] CAPSULE ENDOSCOPY.

## 2011-12-13 NOTE — Progress Notes (Signed)
REVIEWED.   EGD OCT 2013 GASTRITIS, NL DUO bX  TCS OCT 2013 SIMPLE ADENOMA

## 2011-12-15 ENCOUNTER — Encounter (HOSPITAL_COMMUNITY): Payer: Self-pay | Admitting: Pharmacy Technician

## 2011-12-15 ENCOUNTER — Other Ambulatory Visit: Payer: Self-pay | Admitting: Gastroenterology

## 2011-12-15 DIAGNOSIS — D649 Anemia, unspecified: Secondary | ICD-10-CM

## 2011-12-15 NOTE — Telephone Encounter (Signed)
Patient is scheduled for GIVENS on Tuesday Nov 19th at 8:30 I have mailed her instructions and she is aware

## 2011-12-23 ENCOUNTER — Inpatient Hospital Stay (HOSPITAL_COMMUNITY)
Admission: RE | Admit: 2011-12-23 | Discharge: 2011-12-23 | DRG: 812 | Disposition: A | Discharge status: Home / Self Care | Payer: Medicare Other | Source: Ambulatory Visit | Attending: Gastroenterology | Admitting: Gastroenterology

## 2011-12-23 ENCOUNTER — Encounter (HOSPITAL_COMMUNITY)
Admission: RE | Disposition: A | Discharge status: Home / Self Care | Payer: Self-pay | Source: Ambulatory Visit | Attending: Gastroenterology

## 2011-12-23 ENCOUNTER — Encounter (HOSPITAL_COMMUNITY): Payer: Self-pay

## 2011-12-23 DIAGNOSIS — I10 Essential (primary) hypertension: Secondary | ICD-10-CM | POA: Diagnosis present

## 2011-12-23 DIAGNOSIS — K5520 Angiodysplasia of colon without hemorrhage: Secondary | ICD-10-CM | POA: Diagnosis present

## 2011-12-23 DIAGNOSIS — D649 Anemia, unspecified: Secondary | ICD-10-CM

## 2011-12-23 DIAGNOSIS — Z95810 Presence of automatic (implantable) cardiac defibrillator: Secondary | ICD-10-CM

## 2011-12-23 HISTORY — PX: GIVENS CAPSULE STUDY: SHX5432

## 2011-12-23 HISTORY — DX: Presence of automatic (implantable) cardiac defibrillator: Z95.810

## 2011-12-23 SURGERY — IMAGING PROCEDURE, GI TRACT, INTRALUMINAL, VIA CAPSULE

## 2011-12-23 NOTE — Progress Notes (Signed)
Pt A&O and desires to be d/ced.  Capsule study complete, box given to endo RN.  Tele d/ced.  Pt dressed and ambulated (refused wheelchair) to main entrance with staff member.

## 2011-12-23 NOTE — H&P (Addendum)
Primary Care Physician:  Kirk Ruths, MD Primary Gastroenterologist:  Dr. Darrick Penna  Pre-Procedure History & Physical: HPI:  April Robles is a 51 y.o. female here for ANEMIA. PT NEEDS TO COMPLETE HER EVALUATION. PMHx: PACER/DEFIBRILLATOR.  Past Medical History  Diagnosis Date  . Hypertension   . Ulcer disease     remote in 34s  . Hiatal hernia   . History of shingles   . Panic attack   . Automatic implantable cardiac defibrillator in situ 05/2011    2nd (since 2010)  . Fibromyalgia   . Pacemaker 05/2011  . Automatic implantable cardiac defibrillator in situ   . ICD (implantable cardiac defibrillator) in place     Past Surgical History  Procedure Date  . Defibrillator/pacemaker 05/2011    replaced old one  . Tubal ligation 1997  . Cholecystectomy 1997  . Knee arthroscopy     both knees  . Colonoscopy 11/17/2011    Procedure: COLONOSCOPY;  Surgeon: West Bali, MD;  [facility]: [facility];  Service: Endoscopy;  Laterality: N/A;  12:30  . Esophagogastroduodenoscopy 11/17/2011    Procedure: ESOPHAGOGASTRODUODENOSCOPY (EGD);  Surgeon: West Bali, MD;  Location: [facility];  Service: Endoscopy;  Laterality: N/A;    Prior to Admission medications   Medication Sig Start Date End Date Taking? Authorizing Provider  acetaminophen (TYLENOL) 500 MG tablet Take 1,000 mg by mouth every 6 (six) hours as needed. Pain.   Yes Historical Provider, MD  acyclovir (ZOVIRAX) 800 MG tablet Take 800 mg by mouth daily as needed. Shingles.   Yes Historical Provider, MD  ALPRAZolam Prudy Feeler) 0.5 MG tablet Take 0.5 mg by mouth at bedtime as needed. anxiety 11/01/11  Yes Historical Provider, MD  furosemide (LASIX) 20 MG tablet Take 20 mg by mouth daily as needed. Fluid retention   Yes Historical Provider, MD  HYDROcodone-acetaminophen (NORCO) 7.5-325 MG per tablet Take 1 tablet by mouth every 6 (six) hours as needed. Pain 11/01/11  Yes Historical Provider, MD  ibuprofen (ADVIL,MOTRIN) 200 MG  tablet Take 200 mg by mouth every 6 (six) hours as needed. Pain.   Yes Historical Provider, MD  losartan (COZAAR) 50 MG tablet Take 50 mg by mouth daily.  11/01/11  Yes Historical Provider, MD  omeprazole (PRILOSEC) 20 MG capsule Take 20 mg by mouth daily.  11/01/11  Yes Historical Provider, MD    Allergies as of 12/15/2011 - Review Complete 12/15/2011  Allergen Reaction Noted  . Cymbalta (duloxetine hcl) Palpitations 11/05/2011    Family History  Problem Relation Age of Onset  . Alcohol abuse Father   . Coronary artery disease Father   . Diabetes Father   . Stroke Mother   . Hypertension Sister   . Colon cancer Neg Hx     History   Social History  . Marital Status: Single    Spouse Name: N/A    Number of Children: 3  . Years of Education: N/A   Occupational History  . disabled    Social History Main Topics  . Smoking status: Current Every Day Smoker -- 0.2 packs/day for 20 years    Types: Cigarettes  . Smokeless tobacco: Not on file  . Alcohol Use: No  . Drug Use: No  . Sexually Active: Not on file   Other Topics Concern  . Not on file   Social History Narrative   Lives w/ daughter    Review of Systems: See HPI, otherwise negative ROS   Physical Exam: Ht 5\' 10"  (  1.778 m)  Wt 270 lb (122.471 kg)  BMI 38.74 kg/m2 General:   Alert,  pleasant and cooperative in NAD Head:  Normocephalic and atraumatic. Neck:  Supple; Lungs:  Clear throughout to auscultation.    Heart:  Regular rate and rhythm. Abdomen:  Soft, nontender and nondistended. Normal bowel sounds, without guarding, and without rebound.   Neurologic:  Alert and  oriented x4;  grossly normal neurologically.  Impression/Plan:     ANEMIA/defibrilator-pt admitted for observation so study can be complete. GIVENS CAPSULE NOT STUDIED IN PTS WITH PACER/DEFIBRILATOR.  PLAN: 1. GIVENS CAPSULE TODAY 2. ADMIT FOR OBS.

## 2011-12-24 ENCOUNTER — Encounter (HOSPITAL_COMMUNITY): Payer: Self-pay | Admitting: Gastroenterology

## 2011-12-24 NOTE — Progress Notes (Signed)
UR Chart Review Completed  

## 2011-12-29 ENCOUNTER — Other Ambulatory Visit: Payer: Self-pay

## 2011-12-29 DIAGNOSIS — D649 Anemia, unspecified: Secondary | ICD-10-CM

## 2011-12-29 NOTE — Telephone Encounter (Signed)
PLEASE CALL PT.  HER CAPSULE STUDY SHOWS SMLL BOWEL AVMs. SHE MAY LOSE BLOOD FROM THESE BUT NOT SEE IT IN HER STOOL. SHE SHOULD:  1. FOLLOW HER BLOOD COUNTS CLOSELY. CHECK CBC DEC 2013. 2. FOLLOW UP IN  FEB 2014. 4. USE ASA OR NSAIDS ONLY IF MEDICALLY NECESSARY. 5. CONTINUE OMEPRAZOLE.

## 2011-12-29 NOTE — Brief Op Note (Signed)
12/23/2011  2:15 PM  PATIENT:  April Robles  51 y.o. female  PRE-OPERATIVE DIAGNOSIS:  Anemia: AUG 2013 HB 10.9. OCT 2013 FERRITIN 16 TIBC 302 HEME NEG x3  POST-OPERATIVE DIAGNOSIS:  Rare SB AVMs  PROCEDURE:  Procedure(s) (LRB) with comments: GIVENS CAPSULE STUDY (N/A) - 8:30  SURGEON:  Surgeon(s) and Role:    * West Bali, MD - Primary  PATIENT DATA: 269 LBS, 70 IN, WAIST: 45 IN, GASTRIC PASSAGE TIME: 11 m, SB PASSAGE TIME: 1H 29m  RESULTS: LIMITED views of gastric mucosa due to retained contents. No blood in the stomach. RARE SB AVMS SEEN 5AT 50-55% OF THE VIEWING TIME. NO FRESH OR OLD BLOOD seen. No masses or ULCERS. LIMITED VIEWS OF THE COLON DUE TO RETAINED CONTENTS.  DIAGNOSIS: Probable SB AVMS CONTRIBUTING TO ANEMIA  Plan: 1. FOLLOW HB. CHECK CBC DEC 2013. 2. CONSIDER DBE IF SHE DEVELOPS A TRANSFUSION DEPENDENT ANEMIA 3. OPV FEB 2014. 4. ASA OR NSAIDS ONLY IF MEDICALLY NECESSARY. 5. CONTINUE OMEPRAZOLE.

## 2011-12-29 NOTE — Telephone Encounter (Signed)
Results faxed to PCP, recalls set

## 2011-12-29 NOTE — Telephone Encounter (Signed)
Called and informed pt. Lab order mailed to pt.

## 2011-12-30 DIAGNOSIS — M25569 Pain in unspecified knee: Secondary | ICD-10-CM | POA: Diagnosis not present

## 2011-12-30 DIAGNOSIS — F411 Generalized anxiety disorder: Secondary | ICD-10-CM | POA: Diagnosis not present

## 2012-02-19 DIAGNOSIS — J069 Acute upper respiratory infection, unspecified: Secondary | ICD-10-CM | POA: Diagnosis not present

## 2012-02-19 DIAGNOSIS — F411 Generalized anxiety disorder: Secondary | ICD-10-CM | POA: Diagnosis not present

## 2012-02-19 DIAGNOSIS — M549 Dorsalgia, unspecified: Secondary | ICD-10-CM | POA: Diagnosis not present

## 2012-03-17 ENCOUNTER — Telehealth: Payer: Self-pay | Admitting: *Deleted

## 2012-03-17 MED ORDER — HYDROCORTISONE 2.5 % RE CREA: TOPICAL_CREAM | RECTAL | Status: DC

## 2012-03-17 NOTE — Telephone Encounter (Signed)
I spoke with April Robles today to reschedule her appt and she wanted to let you know that she has been experiencing pain in her backside that will not go away. She has tried over the counter medication, but it is not giving her any relief. Please follow up with her. Thank you.

## 2012-03-17 NOTE — Telephone Encounter (Addendum)
PLEASE CALL PT. SHE HAD A TCS OCT 2013 AND SHE HAS FAIRLY SIZEABLE HEMORRHOIDS. I SENT A RX FROM ANUSOL CREAM QID FOR RECTAL PAIN. SHE SHOULD USE THE CREAM FOR THE NEXT 10 DAYS. IF HER SX ARE NOT RESOLVED SHE SHOULD USE IT FOR AN ADDITIONAL 10 DAYS. WHEN SHE COMES TO SEE ME ON MAR 6, WE CAN TALK ABOUT LONG TERM THERAPY WITH HEMORRHOID BANDING. SHE SHOULD USE COLACE ONE OR TWO TIMES A DAY TO SOFTEN HER STOOL. SHE SHOULD AVOID DIARRHEA.

## 2012-03-17 NOTE — Telephone Encounter (Signed)
Tried to call with no answer  

## 2012-03-17 NOTE — Addendum Note (Signed)
Addended by: West Bali on: 03/17/2012 01:50 PM   Modules accepted: Orders

## 2012-03-17 NOTE — Telephone Encounter (Signed)
I called pt. She said she is sore in her rectum. Has days of intermittent pain. Some days no pain at all. She has a regular BM every day. Has not had any blood in stool. Appt had to be rescheduled to 04/08/2012. Please advise!

## 2012-03-18 ENCOUNTER — Ambulatory Visit: Payer: Medicare Other | Admitting: Urgent Care

## 2012-03-22 NOTE — Telephone Encounter (Signed)
Called and informed pt.  

## 2012-04-05 DIAGNOSIS — Z6841 Body Mass Index (BMI) 40.0 and over, adult: Secondary | ICD-10-CM | POA: Diagnosis not present

## 2012-04-07 ENCOUNTER — Encounter: Payer: Self-pay | Admitting: Gastroenterology

## 2012-04-08 ENCOUNTER — Ambulatory Visit (INDEPENDENT_AMBULATORY_CARE_PROVIDER_SITE_OTHER): Payer: Medicare Other | Admitting: Urgent Care

## 2012-04-08 ENCOUNTER — Telehealth: Payer: Self-pay

## 2012-04-08 ENCOUNTER — Encounter: Payer: Self-pay | Admitting: Urgent Care

## 2012-04-08 VITALS — BP 140/90 | HR 84 | Temp 97.9°F | Ht 69.0 in | Wt 270.6 lb

## 2012-04-08 DIAGNOSIS — K649 Unspecified hemorrhoids: Secondary | ICD-10-CM | POA: Diagnosis not present

## 2012-04-08 DIAGNOSIS — K219 Gastro-esophageal reflux disease without esophagitis: Secondary | ICD-10-CM | POA: Insufficient documentation

## 2012-04-08 DIAGNOSIS — K6289 Other specified diseases of anus and rectum: Secondary | ICD-10-CM | POA: Diagnosis not present

## 2012-04-08 DIAGNOSIS — D649 Anemia, unspecified: Secondary | ICD-10-CM

## 2012-04-08 MED ORDER — HYDROCORTISONE ACETATE 25 MG RE SUPP: 25.0000 mg | Freq: Two times a day (BID) | RECTAL | Status: DC

## 2012-04-08 MED ORDER — LIDOCAINE-HYDROCORTISONE ACE 3-1 % RE KIT: 1.0000 "application " | PACK | Freq: Two times a day (BID) | RECTAL | Status: DC

## 2012-04-08 NOTE — Patient Instructions (Addendum)
Continue omeprazole 20 mg daily AnaMantle HC forte twice daily for hemorrhoids Colonoscopy October 2023  We will request her recent CBC from [facility] and make recommendations regarding iron after reviewing Office visit with Dr. Darrick Penna in 6 months

## 2012-04-08 NOTE — Telephone Encounter (Signed)
PER PT REQUEST RX CHANGED. ANUSOL SUPP RX SENT.  PLEASE CALL PT. RX  SENT.

## 2012-04-08 NOTE — Telephone Encounter (Signed)
Pt called and was upset that the Anamantle was going to cost her $4000.00. I called [facility] and spoke to [place]. He said someone must have entered it wrong, it is about $200.00. However, the patient would like to have something cheaper. Mellody Dance said compounding would not be that helpful costwise. He recommends the Anusol [facility] suppositories and they cost about $24.00. Routing to Dr. Darrick Penna since Lorenza Burton, NP has left for the day.

## 2012-04-08 NOTE — Telephone Encounter (Signed)
Called and informed pt.  

## 2012-04-08 NOTE — Progress Notes (Signed)
Referring Provider: Landry Mellow, PA Primary Care Physician:  Kirk Ruths, MD Primary Gastroenterologist:  Dr. Jonette Eva  Chief Complaint  Patient presents with  . Follow-up  . Rectal Pain    HPI:  April Robles is a 52 y.o. female here for follow up for anemia.  She has had an EGD, colonoscopy, small bowel biopsies & GIVENS capsule study.  She had non-h pylori gastritis, multiple colonic polyps, both tubular adenomas & hyperplastic polyps,and small bowel AVMS.  She has noticed some irritation from hemorrhoids and has used proctofoam.  C/o proctalgia.  No rectal bleeding.  Denies fever or chills.  She has pain that is worse with movement.  Denies abdominal pain.  She reports on 3/3 she had a CBC drawn & was told it was a little low.  C/o constipation w/ iron.  Denies NSAIDS.  She is on Prilosec 20 mg daily with history of GERD and remote peptic ulcer disease. She denies any NSAIDs but was previously on prednisone and meloxicam now discontinued.   Past Medical History  Diagnosis Date  . Hypertension   . Ulcer disease     remote in 52s  . Hiatal hernia   . History of shingles   . Panic attack   . Automatic implantable cardiac defibrillator in situ 05/2011    2nd (since 2010)  . Fibromyalgia   . Pacemaker 05/2011  . Automatic implantable cardiac defibrillator in situ   . ICD (implantable cardiac defibrillator) in place     Past Surgical History  Procedure Laterality Date  . Defibrillator/pacemaker  05/2011    replaced old one  . Tubal ligation  1997  . Cholecystectomy  1997  . Knee arthroscopy      both knees  . Colonoscopy  11/17/2011    ZOX:WRUE sessile polyps ranging between 3-59mm in size were found in the ascending colon, at the cecum, in the descending colon, and sigmoid colon/(tubular adenoma.hyperplastic)Mild diverticulosis/Moderate sized internal hemorrhoids  . Esophagogastroduodenoscopy  11/17/2011    SLF:Non-erosive gastritis (inflammation) was found/Negative H  pylori. The mucosa of the esophagus appeared normal, Benign SB bx  . Givens capsule study  12/23/2011    Fields: small bowel AVMs    Current Outpatient Prescriptions  Medication Sig Dispense Refill  . acetaminophen (TYLENOL) 500 MG tablet Take 1,000 mg by mouth every 6 (six) hours as needed. Pain.      Marland Kitchen acyclovir (ZOVIRAX) 800 MG tablet Take 800 mg by mouth daily as needed. Shingles.      . ALPRAZolam (XANAX) 0.5 MG tablet Take 0.5 mg by mouth at bedtime as needed. anxiety      . furosemide (LASIX) 20 MG tablet Take 20 mg by mouth daily as needed. Fluid retention      . HYDROcodone-acetaminophen (NORCO) 7.5-325 MG per tablet Take 1 tablet by mouth every 6 (six) hours as needed. Pain      . ibuprofen (ADVIL,MOTRIN) 200 MG tablet Take 200 mg by mouth every 6 (six) hours as needed. Pain.      Marland Kitchen losartan (COZAAR) 50 MG tablet Take 50 mg by mouth daily.       Marland Kitchen omeprazole (PRILOSEC) 20 MG capsule Take 20 mg by mouth daily.       . hydrocortisone (ANUSOL-HC) 25 MG suppository Place 1 suppository (25 mg total) rectally every 12 (twelve) hours. For 12 days  24 suppository  0   No current facility-administered medications for this visit.    Allergies as of 04/08/2012 - Review Complete  04/08/2012  Allergen Reaction Noted  . Cymbalta (duloxetine hcl) Palpitations 11/05/2011   Review of Systems: Gen: Denies any fever, chills, sweats, anorexia, fatigue, weakness, malaise, weight loss, and sleep disorder CV: Denies chest pain, angina, palpitations, syncope, orthopnea, PND, peripheral edema, and claudication. Resp: Denies dyspnea at rest, dyspnea with exercise, cough, sputum, wheezing, coughing up blood, and pleurisy. GI: Denies vomiting blood, jaundice, and fecal incontinence.   Denies dysphagia or odynophagia. GU : Denies urinary burning, blood in urine, urinary frequency, urinary hesitancy, nocturnal urination, and urinary incontinence. MS: c/o joint pains, knees. Derm: Denies rash, itching, dry  skin, hives, moles, warts, or unhealing ulcers.  Psych: +occasional anxiety & panic attacks  Physical Exam: BP 140/90  Pulse 84  Temp(Src) 97.9 F (36.6 C) (Oral)  Ht 5\' 9"  (1.753 m)  Wt 270 lb 9.6 oz (122.743 kg)  BMI 39.94 kg/m2  LMP 03/23/2012 Patient's last menstrual period was 03/23/2012. General:   Alert,  Well-developed, obese, pleasant and cooperative in NAD. Mouth:  No deformity or lesions,oropharynx pink & moist. Neck:  Supple; no masses or thyromegaly. Lungs:  Clear throughout to auscultation.   No wheezes, crackles, or rhonchi. No acute distress. Heart:  Regular rate and rhythm; +AICD left upper ant chest Abdomen:  Protuberant.  Normal bowel sounds.  No bruits.  Soft, non-tender and non-distended without masses, hepatosplenomegaly or hernias noted.  No guarding or rebound tenderness.  Exam limited given body habitus. Rectal:  PT REFUSED.  Msk:  Symmetrical without gross deformities. Pulses:  Normal pulses noted. Extremities:  No clubbing.  No edema. Neurologic:  Alert and oriented x4;  grossly normal neurologically. Skin:  Intact without significant lesions or rashes. Psych:  Alert and cooperative. Normal mood and affect.

## 2012-04-09 ENCOUNTER — Encounter: Payer: Self-pay | Admitting: Urgent Care

## 2012-04-09 NOTE — Assessment & Plan Note (Signed)
Hx small bowel AVMs.  Anemia improved per pt report.  Will request recent CBC.  No gross bleeding.  If anemia becomes transfusion dependent, consider Double balloon small bowel enteroscopy.  We will request her recent CBC from [facility] and make recommendations regarding iron after reviewing Office visit with Dr. Darrick Penna in 6 months

## 2012-04-09 NOTE — Assessment & Plan Note (Signed)
Pt refused rectal exam due to proctalgia.  She is up-to-date with colonoscopy.  If persists after hemorrhoid treatment, she should call back. She agrees.  AnaMantle HC forte twice daily

## 2012-04-09 NOTE — Assessment & Plan Note (Signed)
Well controlled on omeprazole 20mg daily.  °

## 2012-04-12 NOTE — Progress Notes (Signed)
Faxed to PCP

## 2012-04-19 DIAGNOSIS — Z95818 Presence of other cardiac implants and grafts: Secondary | ICD-10-CM | POA: Diagnosis not present

## 2012-04-23 ENCOUNTER — Telehealth: Payer: Self-pay | Admitting: Urgent Care

## 2012-04-23 NOTE — Telephone Encounter (Signed)
Dawn, Please see if Robbie Lis has recent CBC on this pt. Please FWD to [place] [place] in my absence for review. Thanks!

## 2012-05-05 NOTE — Telephone Encounter (Signed)
Records from [facility].  Labs from 04/05/12: Hgb 11.3, Hct 33.2  Stable.   Repeat CBC in 3 months.

## 2012-05-06 ENCOUNTER — Other Ambulatory Visit: Payer: Self-pay

## 2012-05-06 DIAGNOSIS — D649 Anemia, unspecified: Secondary | ICD-10-CM

## 2012-05-06 LAB — ANEMIA PANEL: platelet count: 281

## 2012-05-06 NOTE — Telephone Encounter (Signed)
LMOM to call. ( Lab order on file for 08/05/2012).

## 2012-05-06 NOTE — Telephone Encounter (Signed)
Pt returned call and was informed.  

## 2012-05-09 DIAGNOSIS — R6889 Other general symptoms and signs: Secondary | ICD-10-CM | POA: Diagnosis not present

## 2012-05-09 DIAGNOSIS — M79609 Pain in unspecified limb: Secondary | ICD-10-CM | POA: Diagnosis not present

## 2012-06-26 DIAGNOSIS — I10 Essential (primary) hypertension: Secondary | ICD-10-CM | POA: Diagnosis not present

## 2012-06-26 DIAGNOSIS — Z79899 Other long term (current) drug therapy: Secondary | ICD-10-CM | POA: Diagnosis not present

## 2012-06-26 DIAGNOSIS — S99929A Unspecified injury of unspecified foot, initial encounter: Secondary | ICD-10-CM | POA: Diagnosis not present

## 2012-06-26 DIAGNOSIS — M25469 Effusion, unspecified knee: Secondary | ICD-10-CM | POA: Diagnosis not present

## 2012-06-26 DIAGNOSIS — R52 Pain, unspecified: Secondary | ICD-10-CM | POA: Diagnosis not present

## 2012-06-26 DIAGNOSIS — F17200 Nicotine dependence, unspecified, uncomplicated: Secondary | ICD-10-CM | POA: Diagnosis not present

## 2012-06-26 DIAGNOSIS — M1710 Unilateral primary osteoarthritis, unspecified knee: Secondary | ICD-10-CM | POA: Diagnosis not present

## 2012-06-26 DIAGNOSIS — M25569 Pain in unspecified knee: Secondary | ICD-10-CM | POA: Diagnosis not present

## 2012-06-26 DIAGNOSIS — Z8249 Family history of ischemic heart disease and other diseases of the circulatory system: Secondary | ICD-10-CM | POA: Diagnosis not present

## 2012-06-26 DIAGNOSIS — Z9889 Other specified postprocedural states: Secondary | ICD-10-CM | POA: Diagnosis not present

## 2012-06-26 DIAGNOSIS — S8990XA Unspecified injury of unspecified lower leg, initial encounter: Secondary | ICD-10-CM | POA: Diagnosis not present

## 2012-07-06 DIAGNOSIS — R55 Syncope and collapse: Secondary | ICD-10-CM | POA: Diagnosis not present

## 2012-07-06 DIAGNOSIS — I441 Atrioventricular block, second degree: Secondary | ICD-10-CM | POA: Diagnosis not present

## 2012-07-06 DIAGNOSIS — I10 Essential (primary) hypertension: Secondary | ICD-10-CM | POA: Diagnosis not present

## 2012-07-06 DIAGNOSIS — I472 Ventricular tachycardia: Secondary | ICD-10-CM | POA: Diagnosis not present

## 2012-07-06 DIAGNOSIS — Z959 Presence of cardiac and vascular implant and graft, unspecified: Secondary | ICD-10-CM | POA: Diagnosis not present

## 2012-07-13 DIAGNOSIS — G8929 Other chronic pain: Secondary | ICD-10-CM | POA: Diagnosis not present

## 2012-07-13 DIAGNOSIS — F411 Generalized anxiety disorder: Secondary | ICD-10-CM | POA: Diagnosis not present

## 2012-07-13 DIAGNOSIS — Z6841 Body Mass Index (BMI) 40.0 and over, adult: Secondary | ICD-10-CM | POA: Diagnosis not present

## 2012-07-24 NOTE — Progress Notes (Signed)
REVIEWED.  Records from [facility].  04/05/12:  Hgb 11.3, Hct 33.2  Repeat CBC in JUL 2014. CALL PT TO HAVE LABS DRAWN IF NOT DONE ALREADY IN JUN 2014.

## 2012-07-27 ENCOUNTER — Other Ambulatory Visit: Payer: Self-pay

## 2012-07-27 DIAGNOSIS — D649 Anemia, unspecified: Secondary | ICD-10-CM

## 2012-07-27 NOTE — Progress Notes (Signed)
Lab order mailed by [facility] on 07/21/2012.

## 2012-09-13 ENCOUNTER — Encounter: Payer: Self-pay | Admitting: Gastroenterology

## 2012-09-16 ENCOUNTER — Telehealth: Payer: Self-pay | Admitting: Gastroenterology

## 2012-09-16 NOTE — Telephone Encounter (Signed)
Please remind patient to have labs done, requested by KJ/SLF.

## 2012-09-16 NOTE — Telephone Encounter (Signed)
Called, [facility] for pt to call and let us know if she had labs done.

## 2012-09-20 NOTE — Telephone Encounter (Signed)
Letter reminder mailed for pt to do labs.

## 2012-10-07 DIAGNOSIS — E875 Hyperkalemia: Secondary | ICD-10-CM | POA: Diagnosis not present

## 2012-10-07 DIAGNOSIS — R252 Cramp and spasm: Secondary | ICD-10-CM | POA: Diagnosis not present

## 2012-10-07 DIAGNOSIS — IMO0001 Reserved for inherently not codable concepts without codable children: Secondary | ICD-10-CM | POA: Diagnosis not present

## 2012-10-07 DIAGNOSIS — F411 Generalized anxiety disorder: Secondary | ICD-10-CM | POA: Diagnosis not present

## 2012-10-07 DIAGNOSIS — Z95810 Presence of automatic (implantable) cardiac defibrillator: Secondary | ICD-10-CM | POA: Diagnosis not present

## 2012-10-07 DIAGNOSIS — F17200 Nicotine dependence, unspecified, uncomplicated: Secondary | ICD-10-CM | POA: Diagnosis not present

## 2012-10-07 DIAGNOSIS — R51 Headache: Secondary | ICD-10-CM | POA: Diagnosis not present

## 2012-10-07 DIAGNOSIS — I10 Essential (primary) hypertension: Secondary | ICD-10-CM | POA: Diagnosis not present

## 2012-10-07 DIAGNOSIS — Z79899 Other long term (current) drug therapy: Secondary | ICD-10-CM | POA: Diagnosis not present

## 2012-11-04 DIAGNOSIS — G8929 Other chronic pain: Secondary | ICD-10-CM | POA: Diagnosis not present

## 2012-11-04 DIAGNOSIS — Z6838 Body mass index (BMI) 38.0-38.9, adult: Secondary | ICD-10-CM | POA: Diagnosis not present

## 2012-11-04 DIAGNOSIS — I10 Essential (primary) hypertension: Secondary | ICD-10-CM | POA: Diagnosis not present

## 2012-11-04 DIAGNOSIS — F411 Generalized anxiety disorder: Secondary | ICD-10-CM | POA: Diagnosis not present

## 2012-12-03 DIAGNOSIS — Z6838 Body mass index (BMI) 38.0-38.9, adult: Secondary | ICD-10-CM | POA: Diagnosis not present

## 2012-12-03 DIAGNOSIS — IMO0002 Reserved for concepts with insufficient information to code with codable children: Secondary | ICD-10-CM | POA: Diagnosis not present

## 2012-12-03 DIAGNOSIS — M778 Other enthesopathies, not elsewhere classified: Secondary | ICD-10-CM | POA: Diagnosis not present

## 2012-12-03 DIAGNOSIS — I499 Cardiac arrhythmia, unspecified: Secondary | ICD-10-CM | POA: Diagnosis not present

## 2012-12-31 DIAGNOSIS — Z01419 Encounter for gynecological examination (general) (routine) without abnormal findings: Secondary | ICD-10-CM | POA: Diagnosis not present

## 2012-12-31 DIAGNOSIS — Z0000 Encounter for general adult medical examination without abnormal findings: Secondary | ICD-10-CM | POA: Diagnosis not present

## 2012-12-31 DIAGNOSIS — Z79899 Other long term (current) drug therapy: Secondary | ICD-10-CM | POA: Diagnosis not present

## 2012-12-31 DIAGNOSIS — M5412 Radiculopathy, cervical region: Secondary | ICD-10-CM | POA: Diagnosis not present

## 2012-12-31 DIAGNOSIS — Z6839 Body mass index (BMI) 39.0-39.9, adult: Secondary | ICD-10-CM | POA: Diagnosis not present

## 2013-01-24 DIAGNOSIS — M5412 Radiculopathy, cervical region: Secondary | ICD-10-CM | POA: Diagnosis not present

## 2013-01-24 DIAGNOSIS — G5620 Lesion of ulnar nerve, unspecified upper limb: Secondary | ICD-10-CM | POA: Diagnosis not present

## 2013-02-28 DIAGNOSIS — F17200 Nicotine dependence, unspecified, uncomplicated: Secondary | ICD-10-CM | POA: Diagnosis not present

## 2013-02-28 DIAGNOSIS — Z8249 Family history of ischemic heart disease and other diseases of the circulatory system: Secondary | ICD-10-CM | POA: Diagnosis not present

## 2013-02-28 DIAGNOSIS — Z79899 Other long term (current) drug therapy: Secondary | ICD-10-CM | POA: Diagnosis not present

## 2013-02-28 DIAGNOSIS — S62319A Displaced fracture of base of unspecified metacarpal bone, initial encounter for closed fracture: Secondary | ICD-10-CM | POA: Diagnosis not present

## 2013-02-28 DIAGNOSIS — Z95810 Presence of automatic (implantable) cardiac defibrillator: Secondary | ICD-10-CM | POA: Diagnosis not present

## 2013-02-28 DIAGNOSIS — I10 Essential (primary) hypertension: Secondary | ICD-10-CM | POA: Diagnosis not present

## 2013-02-28 DIAGNOSIS — IMO0002 Reserved for concepts with insufficient information to code with codable children: Secondary | ICD-10-CM | POA: Diagnosis not present

## 2013-03-02 DIAGNOSIS — I10 Essential (primary) hypertension: Secondary | ICD-10-CM | POA: Diagnosis not present

## 2013-03-02 DIAGNOSIS — R209 Unspecified disturbances of skin sensation: Secondary | ICD-10-CM | POA: Diagnosis not present

## 2013-03-02 DIAGNOSIS — Z6841 Body Mass Index (BMI) 40.0 and over, adult: Secondary | ICD-10-CM | POA: Diagnosis not present

## 2013-03-02 DIAGNOSIS — G8929 Other chronic pain: Secondary | ICD-10-CM | POA: Diagnosis not present

## 2013-03-03 DIAGNOSIS — S62319A Displaced fracture of base of unspecified metacarpal bone, initial encounter for closed fracture: Secondary | ICD-10-CM | POA: Diagnosis not present

## 2013-03-04 DIAGNOSIS — I519 Heart disease, unspecified: Secondary | ICD-10-CM | POA: Diagnosis not present

## 2013-03-04 DIAGNOSIS — F411 Generalized anxiety disorder: Secondary | ICD-10-CM | POA: Diagnosis not present

## 2013-03-04 DIAGNOSIS — I10 Essential (primary) hypertension: Secondary | ICD-10-CM | POA: Diagnosis not present

## 2013-03-04 DIAGNOSIS — S62319A Displaced fracture of base of unspecified metacarpal bone, initial encounter for closed fracture: Secondary | ICD-10-CM | POA: Diagnosis not present

## 2013-03-04 DIAGNOSIS — F17200 Nicotine dependence, unspecified, uncomplicated: Secondary | ICD-10-CM | POA: Diagnosis not present

## 2013-03-04 DIAGNOSIS — Z79899 Other long term (current) drug therapy: Secondary | ICD-10-CM | POA: Diagnosis not present

## 2013-03-04 DIAGNOSIS — Z8489 Family history of other specified conditions: Secondary | ICD-10-CM | POA: Diagnosis not present

## 2013-03-04 DIAGNOSIS — Z95810 Presence of automatic (implantable) cardiac defibrillator: Secondary | ICD-10-CM | POA: Diagnosis not present

## 2013-03-04 DIAGNOSIS — Z8249 Family history of ischemic heart disease and other diseases of the circulatory system: Secondary | ICD-10-CM | POA: Diagnosis not present

## 2013-03-04 DIAGNOSIS — Z6379 Other stressful life events affecting family and household: Secondary | ICD-10-CM | POA: Diagnosis not present

## 2013-03-04 DIAGNOSIS — S62309A Unspecified fracture of unspecified metacarpal bone, initial encounter for closed fracture: Secondary | ICD-10-CM | POA: Diagnosis not present

## 2013-03-04 DIAGNOSIS — Z888 Allergy status to other drugs, medicaments and biological substances status: Secondary | ICD-10-CM | POA: Diagnosis not present

## 2013-03-04 DIAGNOSIS — Z833 Family history of diabetes mellitus: Secondary | ICD-10-CM | POA: Diagnosis not present

## 2013-03-04 DIAGNOSIS — Z823 Family history of stroke: Secondary | ICD-10-CM | POA: Diagnosis not present

## 2013-03-10 DIAGNOSIS — S62319A Displaced fracture of base of unspecified metacarpal bone, initial encounter for closed fracture: Secondary | ICD-10-CM | POA: Diagnosis not present

## 2013-03-30 DIAGNOSIS — S62319A Displaced fracture of base of unspecified metacarpal bone, initial encounter for closed fracture: Secondary | ICD-10-CM | POA: Diagnosis not present

## 2013-04-01 DIAGNOSIS — IMO0001 Reserved for inherently not codable concepts without codable children: Secondary | ICD-10-CM | POA: Diagnosis not present

## 2013-04-01 DIAGNOSIS — M6281 Muscle weakness (generalized): Secondary | ICD-10-CM | POA: Diagnosis not present

## 2013-04-01 DIAGNOSIS — M25549 Pain in joints of unspecified hand: Secondary | ICD-10-CM | POA: Diagnosis not present

## 2013-04-05 DIAGNOSIS — IMO0001 Reserved for inherently not codable concepts without codable children: Secondary | ICD-10-CM | POA: Diagnosis not present

## 2013-04-05 DIAGNOSIS — M25549 Pain in joints of unspecified hand: Secondary | ICD-10-CM | POA: Diagnosis not present

## 2013-04-05 DIAGNOSIS — M6281 Muscle weakness (generalized): Secondary | ICD-10-CM | POA: Diagnosis not present

## 2013-04-24 DIAGNOSIS — IMO0002 Reserved for concepts with insufficient information to code with codable children: Secondary | ICD-10-CM | POA: Diagnosis not present

## 2013-04-24 DIAGNOSIS — Z79899 Other long term (current) drug therapy: Secondary | ICD-10-CM | POA: Diagnosis not present

## 2013-04-24 DIAGNOSIS — M25569 Pain in unspecified knee: Secondary | ICD-10-CM | POA: Diagnosis not present

## 2013-04-24 DIAGNOSIS — F17200 Nicotine dependence, unspecified, uncomplicated: Secondary | ICD-10-CM | POA: Diagnosis not present

## 2013-04-24 DIAGNOSIS — M1710 Unilateral primary osteoarthritis, unspecified knee: Secondary | ICD-10-CM | POA: Diagnosis not present

## 2013-04-24 DIAGNOSIS — Z95810 Presence of automatic (implantable) cardiac defibrillator: Secondary | ICD-10-CM | POA: Diagnosis not present

## 2013-04-24 DIAGNOSIS — I10 Essential (primary) hypertension: Secondary | ICD-10-CM | POA: Diagnosis not present

## 2013-04-27 DIAGNOSIS — S62319A Displaced fracture of base of unspecified metacarpal bone, initial encounter for closed fracture: Secondary | ICD-10-CM | POA: Diagnosis not present

## 2013-05-05 DIAGNOSIS — IMO0001 Reserved for inherently not codable concepts without codable children: Secondary | ICD-10-CM | POA: Diagnosis not present

## 2013-05-05 DIAGNOSIS — M6281 Muscle weakness (generalized): Secondary | ICD-10-CM | POA: Diagnosis not present

## 2013-05-05 DIAGNOSIS — M25549 Pain in joints of unspecified hand: Secondary | ICD-10-CM | POA: Diagnosis not present

## 2013-05-10 DIAGNOSIS — IMO0001 Reserved for inherently not codable concepts without codable children: Secondary | ICD-10-CM | POA: Diagnosis not present

## 2013-05-10 DIAGNOSIS — M25549 Pain in joints of unspecified hand: Secondary | ICD-10-CM | POA: Diagnosis not present

## 2013-05-10 DIAGNOSIS — M6281 Muscle weakness (generalized): Secondary | ICD-10-CM | POA: Diagnosis not present

## 2013-05-19 DIAGNOSIS — Z6838 Body mass index (BMI) 38.0-38.9, adult: Secondary | ICD-10-CM | POA: Diagnosis not present

## 2013-05-19 DIAGNOSIS — M159 Polyosteoarthritis, unspecified: Secondary | ICD-10-CM | POA: Diagnosis not present

## 2013-05-19 DIAGNOSIS — G8929 Other chronic pain: Secondary | ICD-10-CM | POA: Diagnosis not present

## 2013-08-11 DIAGNOSIS — Z6839 Body mass index (BMI) 39.0-39.9, adult: Secondary | ICD-10-CM | POA: Diagnosis not present

## 2013-08-11 DIAGNOSIS — G8929 Other chronic pain: Secondary | ICD-10-CM | POA: Diagnosis not present

## 2013-08-11 DIAGNOSIS — F411 Generalized anxiety disorder: Secondary | ICD-10-CM | POA: Diagnosis not present

## 2013-08-20 DIAGNOSIS — F411 Generalized anxiety disorder: Secondary | ICD-10-CM | POA: Diagnosis not present

## 2013-11-03 DIAGNOSIS — F419 Anxiety disorder, unspecified: Secondary | ICD-10-CM | POA: Diagnosis not present

## 2013-11-03 DIAGNOSIS — Z6839 Body mass index (BMI) 39.0-39.9, adult: Secondary | ICD-10-CM | POA: Diagnosis not present

## 2013-11-03 DIAGNOSIS — M1990 Unspecified osteoarthritis, unspecified site: Secondary | ICD-10-CM | POA: Diagnosis not present

## 2013-11-03 DIAGNOSIS — Z23 Encounter for immunization: Secondary | ICD-10-CM | POA: Diagnosis not present

## 2013-11-03 DIAGNOSIS — G8929 Other chronic pain: Secondary | ICD-10-CM | POA: Diagnosis not present

## 2013-12-15 DIAGNOSIS — I472 Ventricular tachycardia: Secondary | ICD-10-CM | POA: Diagnosis not present

## 2013-12-15 DIAGNOSIS — Z4509 Encounter for adjustment and management of other cardiac device: Secondary | ICD-10-CM | POA: Diagnosis not present

## 2013-12-15 DIAGNOSIS — Z959 Presence of cardiac and vascular implant and graft, unspecified: Secondary | ICD-10-CM | POA: Diagnosis not present

## 2013-12-15 DIAGNOSIS — R55 Syncope and collapse: Secondary | ICD-10-CM | POA: Diagnosis not present

## 2013-12-15 DIAGNOSIS — I441 Atrioventricular block, second degree: Secondary | ICD-10-CM | POA: Diagnosis not present

## 2013-12-15 DIAGNOSIS — I10 Essential (primary) hypertension: Secondary | ICD-10-CM | POA: Diagnosis not present

## 2014-01-05 DIAGNOSIS — M25561 Pain in right knee: Secondary | ICD-10-CM | POA: Diagnosis not present

## 2014-01-05 DIAGNOSIS — M170 Bilateral primary osteoarthritis of knee: Secondary | ICD-10-CM | POA: Diagnosis not present

## 2014-01-05 DIAGNOSIS — M25562 Pain in left knee: Secondary | ICD-10-CM | POA: Diagnosis not present

## 2014-01-05 DIAGNOSIS — F17200 Nicotine dependence, unspecified, uncomplicated: Secondary | ICD-10-CM | POA: Diagnosis not present

## 2014-01-05 DIAGNOSIS — M25462 Effusion, left knee: Secondary | ICD-10-CM | POA: Diagnosis not present

## 2014-01-25 DIAGNOSIS — M25569 Pain in unspecified knee: Secondary | ICD-10-CM | POA: Diagnosis not present

## 2014-01-25 DIAGNOSIS — Z6838 Body mass index (BMI) 38.0-38.9, adult: Secondary | ICD-10-CM | POA: Diagnosis not present

## 2014-01-25 DIAGNOSIS — M797 Fibromyalgia: Secondary | ICD-10-CM | POA: Diagnosis not present

## 2014-02-21 DIAGNOSIS — Z6838 Body mass index (BMI) 38.0-38.9, adult: Secondary | ICD-10-CM | POA: Diagnosis not present

## 2014-02-21 DIAGNOSIS — G894 Chronic pain syndrome: Secondary | ICD-10-CM | POA: Diagnosis not present

## 2014-02-21 DIAGNOSIS — J019 Acute sinusitis, unspecified: Secondary | ICD-10-CM | POA: Diagnosis not present

## 2014-03-21 DIAGNOSIS — Z6839 Body mass index (BMI) 39.0-39.9, adult: Secondary | ICD-10-CM | POA: Diagnosis not present

## 2014-03-21 DIAGNOSIS — G894 Chronic pain syndrome: Secondary | ICD-10-CM | POA: Diagnosis not present

## 2014-04-11 DIAGNOSIS — Z95818 Presence of other cardiac implants and grafts: Secondary | ICD-10-CM | POA: Diagnosis not present

## 2014-04-11 DIAGNOSIS — R55 Syncope and collapse: Secondary | ICD-10-CM | POA: Diagnosis not present

## 2014-05-18 DIAGNOSIS — Z6839 Body mass index (BMI) 39.0-39.9, adult: Secondary | ICD-10-CM | POA: Diagnosis not present

## 2014-05-18 DIAGNOSIS — M1991 Primary osteoarthritis, unspecified site: Secondary | ICD-10-CM | POA: Diagnosis not present

## 2014-05-18 DIAGNOSIS — G894 Chronic pain syndrome: Secondary | ICD-10-CM | POA: Diagnosis not present

## 2014-06-12 DIAGNOSIS — Z6839 Body mass index (BMI) 39.0-39.9, adult: Secondary | ICD-10-CM | POA: Diagnosis not present

## 2014-06-12 DIAGNOSIS — M170 Bilateral primary osteoarthritis of knee: Secondary | ICD-10-CM | POA: Diagnosis not present

## 2014-06-13 DIAGNOSIS — I10 Essential (primary) hypertension: Secondary | ICD-10-CM | POA: Diagnosis not present

## 2014-06-13 DIAGNOSIS — S40861A Insect bite (nonvenomous) of right upper arm, initial encounter: Secondary | ICD-10-CM | POA: Diagnosis not present

## 2014-06-13 DIAGNOSIS — Z720 Tobacco use: Secondary | ICD-10-CM | POA: Diagnosis not present

## 2014-06-13 DIAGNOSIS — Z9851 Tubal ligation status: Secondary | ICD-10-CM | POA: Diagnosis not present

## 2014-06-13 DIAGNOSIS — Z950 Presence of cardiac pacemaker: Secondary | ICD-10-CM | POA: Diagnosis not present

## 2014-06-13 DIAGNOSIS — F419 Anxiety disorder, unspecified: Secondary | ICD-10-CM | POA: Diagnosis not present

## 2014-06-13 DIAGNOSIS — M797 Fibromyalgia: Secondary | ICD-10-CM | POA: Diagnosis not present

## 2014-06-13 DIAGNOSIS — S50861A Insect bite (nonvenomous) of right forearm, initial encounter: Secondary | ICD-10-CM | POA: Diagnosis not present

## 2014-06-13 DIAGNOSIS — Z79899 Other long term (current) drug therapy: Secondary | ICD-10-CM | POA: Diagnosis not present

## 2014-06-13 DIAGNOSIS — Z9049 Acquired absence of other specified parts of digestive tract: Secondary | ICD-10-CM | POA: Diagnosis not present

## 2014-06-13 DIAGNOSIS — Z8249 Family history of ischemic heart disease and other diseases of the circulatory system: Secondary | ICD-10-CM | POA: Diagnosis not present

## 2014-07-13 DIAGNOSIS — Z6841 Body Mass Index (BMI) 40.0 and over, adult: Secondary | ICD-10-CM | POA: Diagnosis not present

## 2014-07-13 DIAGNOSIS — R252 Cramp and spasm: Secondary | ICD-10-CM | POA: Diagnosis not present

## 2014-07-13 DIAGNOSIS — G894 Chronic pain syndrome: Secondary | ICD-10-CM | POA: Diagnosis not present

## 2014-07-13 DIAGNOSIS — F419 Anxiety disorder, unspecified: Secondary | ICD-10-CM | POA: Diagnosis not present

## 2014-09-27 DIAGNOSIS — L02831 Carbuncle of head [any part, except face]: Secondary | ICD-10-CM | POA: Diagnosis not present

## 2014-09-27 DIAGNOSIS — G894 Chronic pain syndrome: Secondary | ICD-10-CM | POA: Diagnosis not present

## 2014-09-27 DIAGNOSIS — Z1389 Encounter for screening for other disorder: Secondary | ICD-10-CM | POA: Diagnosis not present

## 2014-09-27 DIAGNOSIS — Z6841 Body Mass Index (BMI) 40.0 and over, adult: Secondary | ICD-10-CM | POA: Diagnosis not present

## 2014-11-21 DIAGNOSIS — M1991 Primary osteoarthritis, unspecified site: Secondary | ICD-10-CM | POA: Diagnosis not present

## 2014-11-21 DIAGNOSIS — G894 Chronic pain syndrome: Secondary | ICD-10-CM | POA: Diagnosis not present

## 2014-11-21 DIAGNOSIS — Z6841 Body Mass Index (BMI) 40.0 and over, adult: Secondary | ICD-10-CM | POA: Diagnosis not present

## 2014-11-21 DIAGNOSIS — Z1389 Encounter for screening for other disorder: Secondary | ICD-10-CM | POA: Diagnosis not present

## 2014-11-21 DIAGNOSIS — F419 Anxiety disorder, unspecified: Secondary | ICD-10-CM | POA: Diagnosis not present

## 2015-01-15 DIAGNOSIS — Z1389 Encounter for screening for other disorder: Secondary | ICD-10-CM | POA: Diagnosis not present

## 2015-01-15 DIAGNOSIS — R609 Edema, unspecified: Secondary | ICD-10-CM | POA: Diagnosis not present

## 2015-01-15 DIAGNOSIS — Z681 Body mass index (BMI) 19 or less, adult: Secondary | ICD-10-CM | POA: Diagnosis not present

## 2015-01-15 DIAGNOSIS — G894 Chronic pain syndrome: Secondary | ICD-10-CM | POA: Diagnosis not present

## 2015-01-15 DIAGNOSIS — F419 Anxiety disorder, unspecified: Secondary | ICD-10-CM | POA: Diagnosis not present

## 2015-01-22 DIAGNOSIS — M25461 Effusion, right knee: Secondary | ICD-10-CM | POA: Diagnosis not present

## 2015-01-22 DIAGNOSIS — M25562 Pain in left knee: Secondary | ICD-10-CM | POA: Diagnosis not present

## 2015-01-22 DIAGNOSIS — M25661 Stiffness of right knee, not elsewhere classified: Secondary | ICD-10-CM | POA: Diagnosis not present

## 2015-01-22 DIAGNOSIS — M25561 Pain in right knee: Secondary | ICD-10-CM | POA: Diagnosis not present

## 2015-01-25 DIAGNOSIS — M25461 Effusion, right knee: Secondary | ICD-10-CM | POA: Diagnosis not present

## 2015-01-25 DIAGNOSIS — M25661 Stiffness of right knee, not elsewhere classified: Secondary | ICD-10-CM | POA: Diagnosis not present

## 2015-01-25 DIAGNOSIS — M25561 Pain in right knee: Secondary | ICD-10-CM | POA: Diagnosis not present

## 2015-01-25 DIAGNOSIS — M25562 Pain in left knee: Secondary | ICD-10-CM | POA: Diagnosis not present

## 2015-01-31 DIAGNOSIS — M25661 Stiffness of right knee, not elsewhere classified: Secondary | ICD-10-CM | POA: Diagnosis not present

## 2015-01-31 DIAGNOSIS — M25561 Pain in right knee: Secondary | ICD-10-CM | POA: Diagnosis not present

## 2015-01-31 DIAGNOSIS — M25562 Pain in left knee: Secondary | ICD-10-CM | POA: Diagnosis not present

## 2015-01-31 DIAGNOSIS — M25461 Effusion, right knee: Secondary | ICD-10-CM | POA: Diagnosis not present

## 2015-02-02 DIAGNOSIS — M25661 Stiffness of right knee, not elsewhere classified: Secondary | ICD-10-CM | POA: Diagnosis not present

## 2015-02-02 DIAGNOSIS — M25461 Effusion, right knee: Secondary | ICD-10-CM | POA: Diagnosis not present

## 2015-02-02 DIAGNOSIS — M25562 Pain in left knee: Secondary | ICD-10-CM | POA: Diagnosis not present

## 2015-02-02 DIAGNOSIS — M25561 Pain in right knee: Secondary | ICD-10-CM | POA: Diagnosis not present

## 2015-02-05 DIAGNOSIS — M25461 Effusion, right knee: Secondary | ICD-10-CM | POA: Diagnosis not present

## 2015-02-05 DIAGNOSIS — M25661 Stiffness of right knee, not elsewhere classified: Secondary | ICD-10-CM | POA: Diagnosis not present

## 2015-02-05 DIAGNOSIS — M25561 Pain in right knee: Secondary | ICD-10-CM | POA: Diagnosis not present

## 2015-02-05 DIAGNOSIS — M25562 Pain in left knee: Secondary | ICD-10-CM | POA: Diagnosis not present

## 2015-02-07 DIAGNOSIS — M25562 Pain in left knee: Secondary | ICD-10-CM | POA: Diagnosis not present

## 2015-02-07 DIAGNOSIS — M25561 Pain in right knee: Secondary | ICD-10-CM | POA: Diagnosis not present

## 2015-02-07 DIAGNOSIS — M25461 Effusion, right knee: Secondary | ICD-10-CM | POA: Diagnosis not present

## 2015-02-07 DIAGNOSIS — M25661 Stiffness of right knee, not elsewhere classified: Secondary | ICD-10-CM | POA: Diagnosis not present

## 2015-02-14 DIAGNOSIS — M25561 Pain in right knee: Secondary | ICD-10-CM | POA: Diagnosis not present

## 2015-02-14 DIAGNOSIS — M25461 Effusion, right knee: Secondary | ICD-10-CM | POA: Diagnosis not present

## 2015-02-14 DIAGNOSIS — M25661 Stiffness of right knee, not elsewhere classified: Secondary | ICD-10-CM | POA: Diagnosis not present

## 2015-02-14 DIAGNOSIS — M25562 Pain in left knee: Secondary | ICD-10-CM | POA: Diagnosis not present

## 2015-02-15 DIAGNOSIS — M25562 Pain in left knee: Secondary | ICD-10-CM | POA: Diagnosis not present

## 2015-02-15 DIAGNOSIS — M25461 Effusion, right knee: Secondary | ICD-10-CM | POA: Diagnosis not present

## 2015-02-15 DIAGNOSIS — M25661 Stiffness of right knee, not elsewhere classified: Secondary | ICD-10-CM | POA: Diagnosis not present

## 2015-02-15 DIAGNOSIS — M25561 Pain in right knee: Secondary | ICD-10-CM | POA: Diagnosis not present

## 2015-02-16 DIAGNOSIS — R55 Syncope and collapse: Secondary | ICD-10-CM | POA: Diagnosis not present

## 2015-02-16 DIAGNOSIS — I441 Atrioventricular block, second degree: Secondary | ICD-10-CM | POA: Diagnosis not present

## 2015-02-16 DIAGNOSIS — I10 Essential (primary) hypertension: Secondary | ICD-10-CM | POA: Diagnosis not present

## 2015-02-16 DIAGNOSIS — I472 Ventricular tachycardia: Secondary | ICD-10-CM | POA: Diagnosis not present

## 2015-02-16 DIAGNOSIS — Z959 Presence of cardiac and vascular implant and graft, unspecified: Secondary | ICD-10-CM | POA: Diagnosis not present

## 2015-02-16 DIAGNOSIS — Z4509 Encounter for adjustment and management of other cardiac device: Secondary | ICD-10-CM | POA: Diagnosis not present

## 2015-02-21 DIAGNOSIS — M25461 Effusion, right knee: Secondary | ICD-10-CM | POA: Diagnosis not present

## 2015-02-21 DIAGNOSIS — M25562 Pain in left knee: Secondary | ICD-10-CM | POA: Diagnosis not present

## 2015-02-21 DIAGNOSIS — M25561 Pain in right knee: Secondary | ICD-10-CM | POA: Diagnosis not present

## 2015-02-21 DIAGNOSIS — M25661 Stiffness of right knee, not elsewhere classified: Secondary | ICD-10-CM | POA: Diagnosis not present

## 2015-02-22 DIAGNOSIS — M25461 Effusion, right knee: Secondary | ICD-10-CM | POA: Diagnosis not present

## 2015-02-22 DIAGNOSIS — M25561 Pain in right knee: Secondary | ICD-10-CM | POA: Diagnosis not present

## 2015-02-22 DIAGNOSIS — M25661 Stiffness of right knee, not elsewhere classified: Secondary | ICD-10-CM | POA: Diagnosis not present

## 2015-02-22 DIAGNOSIS — M25562 Pain in left knee: Secondary | ICD-10-CM | POA: Diagnosis not present

## 2015-02-28 DIAGNOSIS — Z79899 Other long term (current) drug therapy: Secondary | ICD-10-CM | POA: Diagnosis not present

## 2015-02-28 DIAGNOSIS — I10 Essential (primary) hypertension: Secondary | ICD-10-CM | POA: Diagnosis not present

## 2015-02-28 DIAGNOSIS — E785 Hyperlipidemia, unspecified: Secondary | ICD-10-CM | POA: Diagnosis not present

## 2015-02-28 DIAGNOSIS — R55 Syncope and collapse: Secondary | ICD-10-CM | POA: Diagnosis not present

## 2015-02-28 DIAGNOSIS — I441 Atrioventricular block, second degree: Secondary | ICD-10-CM | POA: Diagnosis not present

## 2015-02-28 DIAGNOSIS — Z4509 Encounter for adjustment and management of other cardiac device: Secondary | ICD-10-CM | POA: Diagnosis not present

## 2015-02-28 DIAGNOSIS — Z7982 Long term (current) use of aspirin: Secondary | ICD-10-CM | POA: Diagnosis not present

## 2015-02-28 DIAGNOSIS — Z6839 Body mass index (BMI) 39.0-39.9, adult: Secondary | ICD-10-CM | POA: Diagnosis not present

## 2015-02-28 DIAGNOSIS — F17210 Nicotine dependence, cigarettes, uncomplicated: Secondary | ICD-10-CM | POA: Diagnosis not present

## 2015-02-28 DIAGNOSIS — Z950 Presence of cardiac pacemaker: Secondary | ICD-10-CM | POA: Diagnosis not present

## 2015-02-28 DIAGNOSIS — E669 Obesity, unspecified: Secondary | ICD-10-CM | POA: Diagnosis not present

## 2015-03-05 DIAGNOSIS — M25661 Stiffness of right knee, not elsewhere classified: Secondary | ICD-10-CM | POA: Diagnosis not present

## 2015-03-05 DIAGNOSIS — M25561 Pain in right knee: Secondary | ICD-10-CM | POA: Diagnosis not present

## 2015-03-05 DIAGNOSIS — M25461 Effusion, right knee: Secondary | ICD-10-CM | POA: Diagnosis not present

## 2015-03-05 DIAGNOSIS — M25562 Pain in left knee: Secondary | ICD-10-CM | POA: Diagnosis not present

## 2015-03-12 DIAGNOSIS — M25562 Pain in left knee: Secondary | ICD-10-CM | POA: Diagnosis not present

## 2015-03-12 DIAGNOSIS — M25661 Stiffness of right knee, not elsewhere classified: Secondary | ICD-10-CM | POA: Diagnosis not present

## 2015-03-12 DIAGNOSIS — M25461 Effusion, right knee: Secondary | ICD-10-CM | POA: Diagnosis not present

## 2015-03-12 DIAGNOSIS — M25561 Pain in right knee: Secondary | ICD-10-CM | POA: Diagnosis not present

## 2015-03-16 DIAGNOSIS — M25661 Stiffness of right knee, not elsewhere classified: Secondary | ICD-10-CM | POA: Diagnosis not present

## 2015-03-16 DIAGNOSIS — M25562 Pain in left knee: Secondary | ICD-10-CM | POA: Diagnosis not present

## 2015-03-16 DIAGNOSIS — M25561 Pain in right knee: Secondary | ICD-10-CM | POA: Diagnosis not present

## 2015-03-16 DIAGNOSIS — M25461 Effusion, right knee: Secondary | ICD-10-CM | POA: Diagnosis not present

## 2015-03-21 DIAGNOSIS — M25461 Effusion, right knee: Secondary | ICD-10-CM | POA: Diagnosis not present

## 2015-03-21 DIAGNOSIS — M25661 Stiffness of right knee, not elsewhere classified: Secondary | ICD-10-CM | POA: Diagnosis not present

## 2015-03-21 DIAGNOSIS — M25561 Pain in right knee: Secondary | ICD-10-CM | POA: Diagnosis not present

## 2015-03-21 DIAGNOSIS — M25562 Pain in left knee: Secondary | ICD-10-CM | POA: Diagnosis not present

## 2015-03-22 DIAGNOSIS — M25562 Pain in left knee: Secondary | ICD-10-CM | POA: Diagnosis not present

## 2015-03-22 DIAGNOSIS — M25661 Stiffness of right knee, not elsewhere classified: Secondary | ICD-10-CM | POA: Diagnosis not present

## 2015-03-22 DIAGNOSIS — M25461 Effusion, right knee: Secondary | ICD-10-CM | POA: Diagnosis not present

## 2015-03-22 DIAGNOSIS — M25561 Pain in right knee: Secondary | ICD-10-CM | POA: Diagnosis not present

## 2015-04-10 DIAGNOSIS — R9431 Abnormal electrocardiogram [ECG] [EKG]: Secondary | ICD-10-CM | POA: Diagnosis not present

## 2015-04-27 DIAGNOSIS — Z1389 Encounter for screening for other disorder: Secondary | ICD-10-CM | POA: Diagnosis not present

## 2015-04-27 DIAGNOSIS — Z6841 Body Mass Index (BMI) 40.0 and over, adult: Secondary | ICD-10-CM | POA: Diagnosis not present

## 2015-04-27 DIAGNOSIS — G894 Chronic pain syndrome: Secondary | ICD-10-CM | POA: Diagnosis not present

## 2015-04-27 DIAGNOSIS — I10 Essential (primary) hypertension: Secondary | ICD-10-CM | POA: Diagnosis not present

## 2015-06-13 DIAGNOSIS — R079 Chest pain, unspecified: Secondary | ICD-10-CM | POA: Diagnosis not present

## 2015-06-13 DIAGNOSIS — R0789 Other chest pain: Secondary | ICD-10-CM | POA: Diagnosis not present

## 2015-06-15 DIAGNOSIS — Z1389 Encounter for screening for other disorder: Secondary | ICD-10-CM | POA: Diagnosis not present

## 2015-06-15 DIAGNOSIS — R0789 Other chest pain: Secondary | ICD-10-CM | POA: Diagnosis not present

## 2015-06-15 DIAGNOSIS — Z6841 Body Mass Index (BMI) 40.0 and over, adult: Secondary | ICD-10-CM | POA: Diagnosis not present

## 2015-07-06 DIAGNOSIS — I10 Essential (primary) hypertension: Secondary | ICD-10-CM | POA: Diagnosis not present

## 2015-07-06 DIAGNOSIS — I472 Ventricular tachycardia: Secondary | ICD-10-CM | POA: Diagnosis not present

## 2015-07-06 DIAGNOSIS — I441 Atrioventricular block, second degree: Secondary | ICD-10-CM | POA: Diagnosis not present

## 2015-07-06 DIAGNOSIS — Z95818 Presence of other cardiac implants and grafts: Secondary | ICD-10-CM | POA: Diagnosis not present

## 2015-07-11 DIAGNOSIS — D509 Iron deficiency anemia, unspecified: Secondary | ICD-10-CM | POA: Diagnosis not present

## 2015-07-11 DIAGNOSIS — R5383 Other fatigue: Secondary | ICD-10-CM | POA: Diagnosis not present

## 2015-07-11 DIAGNOSIS — Z1389 Encounter for screening for other disorder: Secondary | ICD-10-CM | POA: Diagnosis not present

## 2015-07-11 DIAGNOSIS — Z6841 Body Mass Index (BMI) 40.0 and over, adult: Secondary | ICD-10-CM | POA: Diagnosis not present

## 2015-07-11 DIAGNOSIS — G894 Chronic pain syndrome: Secondary | ICD-10-CM | POA: Diagnosis not present

## 2015-07-27 DIAGNOSIS — R809 Proteinuria, unspecified: Secondary | ICD-10-CM | POA: Diagnosis not present

## 2015-07-27 DIAGNOSIS — I10 Essential (primary) hypertension: Secondary | ICD-10-CM | POA: Diagnosis not present

## 2015-07-27 DIAGNOSIS — Z720 Tobacco use: Secondary | ICD-10-CM | POA: Diagnosis not present

## 2015-07-27 DIAGNOSIS — N179 Acute kidney failure, unspecified: Secondary | ICD-10-CM | POA: Diagnosis not present

## 2015-07-27 DIAGNOSIS — G4733 Obstructive sleep apnea (adult) (pediatric): Secondary | ICD-10-CM | POA: Diagnosis not present

## 2015-07-27 DIAGNOSIS — E669 Obesity, unspecified: Secondary | ICD-10-CM | POA: Diagnosis not present

## 2015-08-30 DIAGNOSIS — N184 Chronic kidney disease, stage 4 (severe): Secondary | ICD-10-CM | POA: Diagnosis not present

## 2015-08-30 DIAGNOSIS — I129 Hypertensive chronic kidney disease with stage 1 through stage 4 chronic kidney disease, or unspecified chronic kidney disease: Secondary | ICD-10-CM | POA: Diagnosis not present

## 2015-08-30 DIAGNOSIS — E559 Vitamin D deficiency, unspecified: Secondary | ICD-10-CM | POA: Diagnosis not present

## 2015-08-30 DIAGNOSIS — D649 Anemia, unspecified: Secondary | ICD-10-CM | POA: Diagnosis not present

## 2015-08-30 DIAGNOSIS — Z79899 Other long term (current) drug therapy: Secondary | ICD-10-CM | POA: Diagnosis not present

## 2015-08-30 DIAGNOSIS — R809 Proteinuria, unspecified: Secondary | ICD-10-CM | POA: Diagnosis not present

## 2015-08-30 DIAGNOSIS — N189 Chronic kidney disease, unspecified: Secondary | ICD-10-CM | POA: Diagnosis not present

## 2015-09-19 DIAGNOSIS — E871 Hypo-osmolality and hyponatremia: Secondary | ICD-10-CM | POA: Diagnosis not present

## 2015-09-19 DIAGNOSIS — N183 Chronic kidney disease, stage 3 (moderate): Secondary | ICD-10-CM | POA: Diagnosis not present

## 2015-09-19 DIAGNOSIS — N179 Acute kidney failure, unspecified: Secondary | ICD-10-CM | POA: Diagnosis not present

## 2015-09-19 DIAGNOSIS — E559 Vitamin D deficiency, unspecified: Secondary | ICD-10-CM | POA: Diagnosis not present

## 2015-09-19 DIAGNOSIS — Z720 Tobacco use: Secondary | ICD-10-CM | POA: Diagnosis not present

## 2015-10-02 DIAGNOSIS — N183 Chronic kidney disease, stage 3 (moderate): Secondary | ICD-10-CM | POA: Diagnosis not present

## 2015-10-02 DIAGNOSIS — I129 Hypertensive chronic kidney disease with stage 1 through stage 4 chronic kidney disease, or unspecified chronic kidney disease: Secondary | ICD-10-CM | POA: Diagnosis not present

## 2015-10-02 DIAGNOSIS — R809 Proteinuria, unspecified: Secondary | ICD-10-CM | POA: Diagnosis not present

## 2015-10-02 DIAGNOSIS — Z79899 Other long term (current) drug therapy: Secondary | ICD-10-CM | POA: Diagnosis not present

## 2015-10-05 DIAGNOSIS — Z1389 Encounter for screening for other disorder: Secondary | ICD-10-CM | POA: Diagnosis not present

## 2015-10-05 DIAGNOSIS — Z79891 Long term (current) use of opiate analgesic: Secondary | ICD-10-CM | POA: Diagnosis not present

## 2015-10-05 DIAGNOSIS — F411 Generalized anxiety disorder: Secondary | ICD-10-CM | POA: Diagnosis not present

## 2015-10-05 DIAGNOSIS — G894 Chronic pain syndrome: Secondary | ICD-10-CM | POA: Diagnosis not present

## 2015-10-05 DIAGNOSIS — Z6841 Body Mass Index (BMI) 40.0 and over, adult: Secondary | ICD-10-CM | POA: Diagnosis not present

## 2015-11-12 DIAGNOSIS — Z1389 Encounter for screening for other disorder: Secondary | ICD-10-CM | POA: Diagnosis not present

## 2015-11-12 DIAGNOSIS — F419 Anxiety disorder, unspecified: Secondary | ICD-10-CM | POA: Diagnosis not present

## 2015-11-12 DIAGNOSIS — Z6839 Body mass index (BMI) 39.0-39.9, adult: Secondary | ICD-10-CM | POA: Diagnosis not present

## 2015-11-12 DIAGNOSIS — G894 Chronic pain syndrome: Secondary | ICD-10-CM | POA: Diagnosis not present

## 2015-12-13 DIAGNOSIS — I499 Cardiac arrhythmia, unspecified: Secondary | ICD-10-CM | POA: Diagnosis not present

## 2015-12-13 DIAGNOSIS — F419 Anxiety disorder, unspecified: Secondary | ICD-10-CM | POA: Diagnosis not present

## 2015-12-13 DIAGNOSIS — G894 Chronic pain syndrome: Secondary | ICD-10-CM | POA: Diagnosis not present

## 2015-12-13 DIAGNOSIS — Z6841 Body Mass Index (BMI) 40.0 and over, adult: Secondary | ICD-10-CM | POA: Diagnosis not present

## 2015-12-21 DIAGNOSIS — Z79899 Other long term (current) drug therapy: Secondary | ICD-10-CM | POA: Diagnosis not present

## 2015-12-21 DIAGNOSIS — D509 Iron deficiency anemia, unspecified: Secondary | ICD-10-CM | POA: Diagnosis not present

## 2015-12-21 DIAGNOSIS — N183 Chronic kidney disease, stage 3 (moderate): Secondary | ICD-10-CM | POA: Diagnosis not present

## 2015-12-21 DIAGNOSIS — E559 Vitamin D deficiency, unspecified: Secondary | ICD-10-CM | POA: Diagnosis not present

## 2015-12-21 DIAGNOSIS — I129 Hypertensive chronic kidney disease with stage 1 through stage 4 chronic kidney disease, or unspecified chronic kidney disease: Secondary | ICD-10-CM | POA: Diagnosis not present

## 2015-12-21 DIAGNOSIS — R809 Proteinuria, unspecified: Secondary | ICD-10-CM | POA: Diagnosis not present

## 2016-01-02 DIAGNOSIS — E559 Vitamin D deficiency, unspecified: Secondary | ICD-10-CM | POA: Diagnosis not present

## 2016-01-02 DIAGNOSIS — N182 Chronic kidney disease, stage 2 (mild): Secondary | ICD-10-CM | POA: Diagnosis not present

## 2016-01-02 DIAGNOSIS — E669 Obesity, unspecified: Secondary | ICD-10-CM | POA: Diagnosis not present

## 2016-01-02 DIAGNOSIS — R809 Proteinuria, unspecified: Secondary | ICD-10-CM | POA: Diagnosis not present

## 2016-01-09 DIAGNOSIS — E782 Mixed hyperlipidemia: Secondary | ICD-10-CM | POA: Diagnosis not present

## 2016-01-09 DIAGNOSIS — G894 Chronic pain syndrome: Secondary | ICD-10-CM | POA: Diagnosis not present

## 2016-01-09 DIAGNOSIS — I10 Essential (primary) hypertension: Secondary | ICD-10-CM | POA: Diagnosis not present

## 2016-01-09 DIAGNOSIS — Z6841 Body Mass Index (BMI) 40.0 and over, adult: Secondary | ICD-10-CM | POA: Diagnosis not present

## 2016-01-09 DIAGNOSIS — Z23 Encounter for immunization: Secondary | ICD-10-CM | POA: Diagnosis not present

## 2016-01-30 DIAGNOSIS — F17200 Nicotine dependence, unspecified, uncomplicated: Secondary | ICD-10-CM | POA: Diagnosis not present

## 2016-01-30 DIAGNOSIS — M797 Fibromyalgia: Secondary | ICD-10-CM | POA: Diagnosis not present

## 2016-01-30 DIAGNOSIS — F419 Anxiety disorder, unspecified: Secondary | ICD-10-CM | POA: Diagnosis not present

## 2016-01-30 DIAGNOSIS — Z79899 Other long term (current) drug therapy: Secondary | ICD-10-CM | POA: Diagnosis not present

## 2016-01-30 DIAGNOSIS — Z720 Tobacco use: Secondary | ICD-10-CM | POA: Diagnosis not present

## 2016-01-30 DIAGNOSIS — I10 Essential (primary) hypertension: Secondary | ICD-10-CM | POA: Diagnosis not present

## 2016-02-12 ENCOUNTER — Other Ambulatory Visit (HOSPITAL_COMMUNITY): Payer: Self-pay | Admitting: Internal Medicine

## 2016-02-12 DIAGNOSIS — Z1231 Encounter for screening mammogram for malignant neoplasm of breast: Secondary | ICD-10-CM

## 2016-02-12 DIAGNOSIS — Z6841 Body Mass Index (BMI) 40.0 and over, adult: Secondary | ICD-10-CM | POA: Diagnosis not present

## 2016-02-12 DIAGNOSIS — G894 Chronic pain syndrome: Secondary | ICD-10-CM | POA: Diagnosis not present

## 2016-02-12 DIAGNOSIS — F419 Anxiety disorder, unspecified: Secondary | ICD-10-CM | POA: Diagnosis not present

## 2016-02-12 DIAGNOSIS — K219 Gastro-esophageal reflux disease without esophagitis: Secondary | ICD-10-CM | POA: Diagnosis not present

## 2016-02-12 DIAGNOSIS — Z1389 Encounter for screening for other disorder: Secondary | ICD-10-CM | POA: Diagnosis not present

## 2016-02-12 DIAGNOSIS — F410 Panic disorder [episodic paroxysmal anxiety] without agoraphobia: Secondary | ICD-10-CM | POA: Diagnosis not present

## 2016-02-12 DIAGNOSIS — R252 Cramp and spasm: Secondary | ICD-10-CM | POA: Diagnosis not present

## 2016-02-18 ENCOUNTER — Telehealth: Payer: Self-pay | Admitting: Orthopaedic Surgery

## 2016-02-18 NOTE — Telephone Encounter (Signed)
I called patient to offer an appointment, but she wasn't at home so I left a message with the girl that answered the phone for the patient to call our office. Patient called back and was only wanting to get a knee brace. I told her that her referral from [facility] stated she was having bilateral knee pain and was needing to be seen. She didn't want to talk with me about an appointment just getting a knee brace, she stated that she was always going to hurt. I told her that I would be glad to setup an appointment with one of our doctors and she could discuss the need for a knee brace with the doctor then. She was not happy about this and hung up.

## 2016-04-10 DIAGNOSIS — I10 Essential (primary) hypertension: Secondary | ICD-10-CM | POA: Diagnosis not present

## 2016-04-10 DIAGNOSIS — Z95810 Presence of automatic (implantable) cardiac defibrillator: Secondary | ICD-10-CM | POA: Diagnosis not present

## 2016-04-10 DIAGNOSIS — Z96653 Presence of artificial knee joint, bilateral: Secondary | ICD-10-CM | POA: Diagnosis not present

## 2016-04-10 DIAGNOSIS — M25562 Pain in left knee: Secondary | ICD-10-CM | POA: Diagnosis not present

## 2016-04-10 DIAGNOSIS — M25561 Pain in right knee: Secondary | ICD-10-CM | POA: Diagnosis not present

## 2016-04-10 DIAGNOSIS — F419 Anxiety disorder, unspecified: Secondary | ICD-10-CM | POA: Diagnosis not present

## 2016-04-10 DIAGNOSIS — Z79899 Other long term (current) drug therapy: Secondary | ICD-10-CM | POA: Diagnosis not present

## 2016-04-10 DIAGNOSIS — G8928 Other chronic postprocedural pain: Secondary | ICD-10-CM | POA: Diagnosis not present

## 2016-04-10 DIAGNOSIS — F17200 Nicotine dependence, unspecified, uncomplicated: Secondary | ICD-10-CM | POA: Diagnosis not present

## 2016-04-10 DIAGNOSIS — M797 Fibromyalgia: Secondary | ICD-10-CM | POA: Diagnosis not present

## 2016-04-10 DIAGNOSIS — G8929 Other chronic pain: Secondary | ICD-10-CM | POA: Diagnosis not present

## 2016-05-01 DIAGNOSIS — F419 Anxiety disorder, unspecified: Secondary | ICD-10-CM | POA: Diagnosis not present

## 2016-05-01 DIAGNOSIS — Z6836 Body mass index (BMI) 36.0-36.9, adult: Secondary | ICD-10-CM | POA: Diagnosis not present

## 2016-05-01 DIAGNOSIS — I10 Essential (primary) hypertension: Secondary | ICD-10-CM | POA: Diagnosis not present

## 2016-05-01 DIAGNOSIS — G894 Chronic pain syndrome: Secondary | ICD-10-CM | POA: Diagnosis not present

## 2016-06-03 DIAGNOSIS — N183 Chronic kidney disease, stage 3 (moderate): Secondary | ICD-10-CM | POA: Diagnosis not present

## 2016-06-03 DIAGNOSIS — E559 Vitamin D deficiency, unspecified: Secondary | ICD-10-CM | POA: Diagnosis not present

## 2016-06-03 DIAGNOSIS — Z79899 Other long term (current) drug therapy: Secondary | ICD-10-CM | POA: Diagnosis not present

## 2016-06-03 DIAGNOSIS — D509 Iron deficiency anemia, unspecified: Secondary | ICD-10-CM | POA: Diagnosis not present

## 2016-06-03 DIAGNOSIS — R809 Proteinuria, unspecified: Secondary | ICD-10-CM | POA: Diagnosis not present

## 2016-06-03 DIAGNOSIS — I129 Hypertensive chronic kidney disease with stage 1 through stage 4 chronic kidney disease, or unspecified chronic kidney disease: Secondary | ICD-10-CM | POA: Diagnosis not present

## 2016-06-05 DIAGNOSIS — S43101A Unspecified dislocation of right acromioclavicular joint, initial encounter: Secondary | ICD-10-CM | POA: Diagnosis not present

## 2016-06-05 DIAGNOSIS — S81012A Laceration without foreign body, left knee, initial encounter: Secondary | ICD-10-CM | POA: Diagnosis not present

## 2016-06-11 DIAGNOSIS — I10 Essential (primary) hypertension: Secondary | ICD-10-CM | POA: Diagnosis not present

## 2016-06-11 DIAGNOSIS — R809 Proteinuria, unspecified: Secondary | ICD-10-CM | POA: Diagnosis not present

## 2016-06-11 DIAGNOSIS — N250 Renal osteodystrophy: Secondary | ICD-10-CM | POA: Diagnosis not present

## 2016-06-11 DIAGNOSIS — N182 Chronic kidney disease, stage 2 (mild): Secondary | ICD-10-CM | POA: Diagnosis not present

## 2016-07-14 DIAGNOSIS — Z1231 Encounter for screening mammogram for malignant neoplasm of breast: Secondary | ICD-10-CM | POA: Diagnosis not present

## 2016-07-23 DIAGNOSIS — Z9889 Other specified postprocedural states: Secondary | ICD-10-CM | POA: Diagnosis not present

## 2016-07-23 DIAGNOSIS — R928 Other abnormal and inconclusive findings on diagnostic imaging of breast: Secondary | ICD-10-CM | POA: Diagnosis not present

## 2016-07-25 DIAGNOSIS — M545 Low back pain: Secondary | ICD-10-CM | POA: Diagnosis not present

## 2016-07-25 DIAGNOSIS — F419 Anxiety disorder, unspecified: Secondary | ICD-10-CM | POA: Diagnosis not present

## 2016-07-25 DIAGNOSIS — Z6841 Body Mass Index (BMI) 40.0 and over, adult: Secondary | ICD-10-CM | POA: Diagnosis not present

## 2016-10-14 DIAGNOSIS — D509 Iron deficiency anemia, unspecified: Secondary | ICD-10-CM | POA: Diagnosis not present

## 2016-10-14 DIAGNOSIS — G894 Chronic pain syndrome: Secondary | ICD-10-CM | POA: Diagnosis not present

## 2016-10-14 DIAGNOSIS — Z79899 Other long term (current) drug therapy: Secondary | ICD-10-CM | POA: Diagnosis not present

## 2016-10-14 DIAGNOSIS — N183 Chronic kidney disease, stage 3 (moderate): Secondary | ICD-10-CM | POA: Diagnosis not present

## 2016-10-14 DIAGNOSIS — I129 Hypertensive chronic kidney disease with stage 1 through stage 4 chronic kidney disease, or unspecified chronic kidney disease: Secondary | ICD-10-CM | POA: Diagnosis not present

## 2016-10-14 DIAGNOSIS — F419 Anxiety disorder, unspecified: Secondary | ICD-10-CM | POA: Diagnosis not present

## 2016-10-14 DIAGNOSIS — E559 Vitamin D deficiency, unspecified: Secondary | ICD-10-CM | POA: Diagnosis not present

## 2016-10-14 DIAGNOSIS — Z6841 Body Mass Index (BMI) 40.0 and over, adult: Secondary | ICD-10-CM | POA: Diagnosis not present

## 2016-10-14 DIAGNOSIS — R809 Proteinuria, unspecified: Secondary | ICD-10-CM | POA: Diagnosis not present

## 2016-10-14 DIAGNOSIS — B029 Zoster without complications: Secondary | ICD-10-CM | POA: Diagnosis not present

## 2016-10-21 DIAGNOSIS — D649 Anemia, unspecified: Secondary | ICD-10-CM | POA: Diagnosis not present

## 2016-10-21 DIAGNOSIS — R809 Proteinuria, unspecified: Secondary | ICD-10-CM | POA: Diagnosis not present

## 2016-10-21 DIAGNOSIS — I10 Essential (primary) hypertension: Secondary | ICD-10-CM | POA: Diagnosis not present

## 2016-10-21 DIAGNOSIS — N182 Chronic kidney disease, stage 2 (mild): Secondary | ICD-10-CM | POA: Diagnosis not present

## 2016-12-22 DIAGNOSIS — Z23 Encounter for immunization: Secondary | ICD-10-CM | POA: Diagnosis not present

## 2016-12-22 DIAGNOSIS — M159 Polyosteoarthritis, unspecified: Secondary | ICD-10-CM | POA: Diagnosis not present

## 2016-12-22 DIAGNOSIS — G894 Chronic pain syndrome: Secondary | ICD-10-CM | POA: Diagnosis not present

## 2016-12-22 DIAGNOSIS — Z1389 Encounter for screening for other disorder: Secondary | ICD-10-CM | POA: Diagnosis not present

## 2016-12-22 DIAGNOSIS — Z6841 Body Mass Index (BMI) 40.0 and over, adult: Secondary | ICD-10-CM | POA: Diagnosis not present

## 2016-12-22 DIAGNOSIS — J40 Bronchitis, not specified as acute or chronic: Secondary | ICD-10-CM | POA: Diagnosis not present

## 2017-02-12 DIAGNOSIS — N183 Chronic kidney disease, stage 3 (moderate): Secondary | ICD-10-CM | POA: Diagnosis not present

## 2017-02-12 DIAGNOSIS — I129 Hypertensive chronic kidney disease with stage 1 through stage 4 chronic kidney disease, or unspecified chronic kidney disease: Secondary | ICD-10-CM | POA: Diagnosis not present

## 2017-02-12 DIAGNOSIS — R809 Proteinuria, unspecified: Secondary | ICD-10-CM | POA: Diagnosis not present

## 2017-02-12 DIAGNOSIS — D509 Iron deficiency anemia, unspecified: Secondary | ICD-10-CM | POA: Diagnosis not present

## 2017-02-12 DIAGNOSIS — E559 Vitamin D deficiency, unspecified: Secondary | ICD-10-CM | POA: Diagnosis not present

## 2017-02-12 DIAGNOSIS — Z79899 Other long term (current) drug therapy: Secondary | ICD-10-CM | POA: Diagnosis not present

## 2017-04-02 ENCOUNTER — Ambulatory Visit (HOSPITAL_COMMUNITY)
Admission: RE | Admit: 2017-04-02 | Discharge: 2017-04-02 | Disposition: A | Discharge status: Home / Self Care | Payer: Medicare Other | Source: Ambulatory Visit | Attending: Physician Assistant | Admitting: Physician Assistant

## 2017-04-02 ENCOUNTER — Other Ambulatory Visit (HOSPITAL_COMMUNITY): Payer: Self-pay | Admitting: Physician Assistant

## 2017-04-02 DIAGNOSIS — M25562 Pain in left knee: Secondary | ICD-10-CM

## 2017-04-02 DIAGNOSIS — M160 Bilateral primary osteoarthritis of hip: Secondary | ICD-10-CM | POA: Diagnosis not present

## 2017-04-02 DIAGNOSIS — M1712 Unilateral primary osteoarthritis, left knee: Secondary | ICD-10-CM | POA: Insufficient documentation

## 2017-04-02 DIAGNOSIS — Z6841 Body Mass Index (BMI) 40.0 and over, adult: Secondary | ICD-10-CM | POA: Diagnosis not present

## 2017-04-02 DIAGNOSIS — Z1389 Encounter for screening for other disorder: Secondary | ICD-10-CM | POA: Diagnosis not present

## 2017-04-02 DIAGNOSIS — G894 Chronic pain syndrome: Secondary | ICD-10-CM | POA: Diagnosis not present

## 2017-04-02 DIAGNOSIS — M47816 Spondylosis without myelopathy or radiculopathy, lumbar region: Secondary | ICD-10-CM | POA: Insufficient documentation

## 2017-04-02 DIAGNOSIS — M25552 Pain in left hip: Secondary | ICD-10-CM | POA: Diagnosis not present

## 2017-04-02 DIAGNOSIS — M159 Polyosteoarthritis, unspecified: Secondary | ICD-10-CM | POA: Diagnosis not present

## 2017-06-23 DIAGNOSIS — F419 Anxiety disorder, unspecified: Secondary | ICD-10-CM | POA: Diagnosis not present

## 2017-06-23 DIAGNOSIS — Z6841 Body Mass Index (BMI) 40.0 and over, adult: Secondary | ICD-10-CM | POA: Diagnosis not present

## 2017-06-23 DIAGNOSIS — E785 Hyperlipidemia, unspecified: Secondary | ICD-10-CM | POA: Diagnosis not present

## 2017-06-23 DIAGNOSIS — E782 Mixed hyperlipidemia: Secondary | ICD-10-CM | POA: Diagnosis not present

## 2017-06-23 DIAGNOSIS — Z1389 Encounter for screening for other disorder: Secondary | ICD-10-CM | POA: Diagnosis not present

## 2017-06-23 DIAGNOSIS — G894 Chronic pain syndrome: Secondary | ICD-10-CM | POA: Diagnosis not present

## 2017-06-23 DIAGNOSIS — Z01411 Encounter for gynecological examination (general) (routine) with abnormal findings: Secondary | ICD-10-CM | POA: Diagnosis not present

## 2017-06-23 DIAGNOSIS — R946 Abnormal results of thyroid function studies: Secondary | ICD-10-CM | POA: Diagnosis not present

## 2017-06-23 DIAGNOSIS — Z0000 Encounter for general adult medical examination without abnormal findings: Secondary | ICD-10-CM | POA: Diagnosis not present

## 2017-06-23 DIAGNOSIS — R7309 Other abnormal glucose: Secondary | ICD-10-CM | POA: Diagnosis not present

## 2017-06-23 DIAGNOSIS — M159 Polyosteoarthritis, unspecified: Secondary | ICD-10-CM | POA: Diagnosis not present

## 2017-07-03 DIAGNOSIS — D509 Iron deficiency anemia, unspecified: Secondary | ICD-10-CM | POA: Diagnosis not present

## 2017-07-03 DIAGNOSIS — R809 Proteinuria, unspecified: Secondary | ICD-10-CM | POA: Diagnosis not present

## 2017-07-03 DIAGNOSIS — Z79899 Other long term (current) drug therapy: Secondary | ICD-10-CM | POA: Diagnosis not present

## 2017-07-03 DIAGNOSIS — E559 Vitamin D deficiency, unspecified: Secondary | ICD-10-CM | POA: Diagnosis not present

## 2017-07-03 DIAGNOSIS — N183 Chronic kidney disease, stage 3 (moderate): Secondary | ICD-10-CM | POA: Diagnosis not present

## 2017-07-03 DIAGNOSIS — I129 Hypertensive chronic kidney disease with stage 1 through stage 4 chronic kidney disease, or unspecified chronic kidney disease: Secondary | ICD-10-CM | POA: Diagnosis not present

## 2017-07-07 DIAGNOSIS — D649 Anemia, unspecified: Secondary | ICD-10-CM | POA: Diagnosis not present

## 2017-07-07 DIAGNOSIS — I10 Essential (primary) hypertension: Secondary | ICD-10-CM | POA: Diagnosis not present

## 2017-07-07 DIAGNOSIS — R809 Proteinuria, unspecified: Secondary | ICD-10-CM | POA: Diagnosis not present

## 2017-07-07 DIAGNOSIS — N182 Chronic kidney disease, stage 2 (mild): Secondary | ICD-10-CM | POA: Diagnosis not present

## 2017-08-17 DIAGNOSIS — F419 Anxiety disorder, unspecified: Secondary | ICD-10-CM | POA: Diagnosis not present

## 2017-08-17 DIAGNOSIS — F17200 Nicotine dependence, unspecified, uncomplicated: Secondary | ICD-10-CM | POA: Diagnosis not present

## 2017-08-17 DIAGNOSIS — M797 Fibromyalgia: Secondary | ICD-10-CM | POA: Diagnosis not present

## 2017-08-17 DIAGNOSIS — D649 Anemia, unspecified: Secondary | ICD-10-CM | POA: Diagnosis not present

## 2017-08-17 DIAGNOSIS — R0689 Other abnormalities of breathing: Secondary | ICD-10-CM | POA: Diagnosis not present

## 2017-08-17 DIAGNOSIS — I10 Essential (primary) hypertension: Secondary | ICD-10-CM | POA: Diagnosis not present

## 2017-08-17 DIAGNOSIS — Z79899 Other long term (current) drug therapy: Secondary | ICD-10-CM | POA: Diagnosis not present

## 2017-08-17 DIAGNOSIS — Z95810 Presence of automatic (implantable) cardiac defibrillator: Secondary | ICD-10-CM | POA: Diagnosis not present

## 2017-08-17 DIAGNOSIS — R457 State of emotional shock and stress, unspecified: Secondary | ICD-10-CM | POA: Diagnosis not present

## 2017-08-17 DIAGNOSIS — F411 Generalized anxiety disorder: Secondary | ICD-10-CM | POA: Diagnosis not present

## 2017-08-17 DIAGNOSIS — R252 Cramp and spasm: Secondary | ICD-10-CM | POA: Diagnosis not present

## 2017-08-17 DIAGNOSIS — R000 Tachycardia, unspecified: Secondary | ICD-10-CM | POA: Diagnosis not present

## 2017-10-01 DIAGNOSIS — Z6841 Body Mass Index (BMI) 40.0 and over, adult: Secondary | ICD-10-CM | POA: Diagnosis not present

## 2017-10-01 DIAGNOSIS — R609 Edema, unspecified: Secondary | ICD-10-CM | POA: Diagnosis not present

## 2017-10-01 DIAGNOSIS — R7309 Other abnormal glucose: Secondary | ICD-10-CM | POA: Diagnosis not present

## 2017-10-01 DIAGNOSIS — G894 Chronic pain syndrome: Secondary | ICD-10-CM | POA: Diagnosis not present

## 2017-10-01 DIAGNOSIS — F419 Anxiety disorder, unspecified: Secondary | ICD-10-CM | POA: Diagnosis not present

## 2017-10-01 DIAGNOSIS — M159 Polyosteoarthritis, unspecified: Secondary | ICD-10-CM | POA: Diagnosis not present

## 2017-12-02 DIAGNOSIS — M79604 Pain in right leg: Secondary | ICD-10-CM | POA: Diagnosis not present

## 2017-12-02 DIAGNOSIS — M1711 Unilateral primary osteoarthritis, right knee: Secondary | ICD-10-CM | POA: Diagnosis not present

## 2017-12-02 DIAGNOSIS — M1990 Unspecified osteoarthritis, unspecified site: Secondary | ICD-10-CM | POA: Diagnosis not present

## 2017-12-03 DIAGNOSIS — R2241 Localized swelling, mass and lump, right lower limb: Secondary | ICD-10-CM | POA: Diagnosis not present

## 2017-12-03 DIAGNOSIS — R600 Localized edema: Secondary | ICD-10-CM | POA: Diagnosis not present

## 2017-12-03 DIAGNOSIS — M79604 Pain in right leg: Secondary | ICD-10-CM | POA: Diagnosis not present

## 2017-12-21 DIAGNOSIS — F419 Anxiety disorder, unspecified: Secondary | ICD-10-CM | POA: Diagnosis not present

## 2017-12-21 DIAGNOSIS — Z1389 Encounter for screening for other disorder: Secondary | ICD-10-CM | POA: Diagnosis not present

## 2017-12-21 DIAGNOSIS — G894 Chronic pain syndrome: Secondary | ICD-10-CM | POA: Diagnosis not present

## 2017-12-21 DIAGNOSIS — Z23 Encounter for immunization: Secondary | ICD-10-CM | POA: Diagnosis not present

## 2017-12-21 DIAGNOSIS — Z6841 Body Mass Index (BMI) 40.0 and over, adult: Secondary | ICD-10-CM | POA: Diagnosis not present

## 2018-01-21 DIAGNOSIS — G894 Chronic pain syndrome: Secondary | ICD-10-CM | POA: Diagnosis not present

## 2018-01-21 DIAGNOSIS — Z6841 Body Mass Index (BMI) 40.0 and over, adult: Secondary | ICD-10-CM | POA: Diagnosis not present

## 2018-01-21 DIAGNOSIS — R7309 Other abnormal glucose: Secondary | ICD-10-CM | POA: Diagnosis not present

## 2018-02-25 DIAGNOSIS — Z6841 Body Mass Index (BMI) 40.0 and over, adult: Secondary | ICD-10-CM | POA: Diagnosis not present

## 2018-02-25 DIAGNOSIS — M545 Low back pain: Secondary | ICD-10-CM | POA: Diagnosis not present

## 2018-03-23 DIAGNOSIS — E119 Type 2 diabetes mellitus without complications: Secondary | ICD-10-CM | POA: Diagnosis not present

## 2018-03-23 DIAGNOSIS — M545 Low back pain: Secondary | ICD-10-CM | POA: Diagnosis not present

## 2018-03-23 DIAGNOSIS — Z1389 Encounter for screening for other disorder: Secondary | ICD-10-CM | POA: Diagnosis not present

## 2018-03-23 DIAGNOSIS — Z6841 Body Mass Index (BMI) 40.0 and over, adult: Secondary | ICD-10-CM | POA: Diagnosis not present

## 2018-03-23 DIAGNOSIS — R7309 Other abnormal glucose: Secondary | ICD-10-CM | POA: Diagnosis not present

## 2018-04-19 DIAGNOSIS — Z6841 Body Mass Index (BMI) 40.0 and over, adult: Secondary | ICD-10-CM | POA: Diagnosis not present

## 2018-04-19 DIAGNOSIS — J029 Acute pharyngitis, unspecified: Secondary | ICD-10-CM | POA: Diagnosis not present

## 2018-04-19 DIAGNOSIS — H9203 Otalgia, bilateral: Secondary | ICD-10-CM | POA: Diagnosis not present

## 2018-04-19 DIAGNOSIS — H6993 Unspecified Eustachian tube disorder, bilateral: Secondary | ICD-10-CM | POA: Diagnosis not present

## 2018-05-17 DIAGNOSIS — R05 Cough: Secondary | ICD-10-CM | POA: Diagnosis not present

## 2018-05-17 DIAGNOSIS — R252 Cramp and spasm: Secondary | ICD-10-CM | POA: Diagnosis not present

## 2018-05-17 DIAGNOSIS — Z6841 Body Mass Index (BMI) 40.0 and over, adult: Secondary | ICD-10-CM | POA: Diagnosis not present

## 2018-05-17 DIAGNOSIS — G894 Chronic pain syndrome: Secondary | ICD-10-CM | POA: Diagnosis not present

## 2018-05-26 ENCOUNTER — Encounter: Payer: Self-pay | Admitting: Neurology

## 2018-06-02 ENCOUNTER — Telehealth: Payer: Self-pay | Admitting: Neurology

## 2018-06-02 NOTE — Telephone Encounter (Signed)
Called this patient x 2.  Number is not working at this time.  She is not a patient of Dr. Serita Grit yet.  Appt. In June.

## 2018-06-02 NOTE — Telephone Encounter (Signed)
Patient has some questions concerning taking OTC Magnesium. Can you help her? She was confused and said we told her to start taking it before her appt... Thanks!

## 2018-06-16 DIAGNOSIS — Z6841 Body Mass Index (BMI) 40.0 and over, adult: Secondary | ICD-10-CM | POA: Diagnosis not present

## 2018-06-16 DIAGNOSIS — G894 Chronic pain syndrome: Secondary | ICD-10-CM | POA: Diagnosis not present

## 2018-07-01 DIAGNOSIS — Z0000 Encounter for general adult medical examination without abnormal findings: Secondary | ICD-10-CM | POA: Diagnosis not present

## 2018-07-01 DIAGNOSIS — Z681 Body mass index (BMI) 19 or less, adult: Secondary | ICD-10-CM | POA: Diagnosis not present

## 2018-07-01 DIAGNOSIS — Z1389 Encounter for screening for other disorder: Secondary | ICD-10-CM | POA: Diagnosis not present

## 2018-07-15 DIAGNOSIS — M159 Polyosteoarthritis, unspecified: Secondary | ICD-10-CM | POA: Diagnosis not present

## 2018-07-15 DIAGNOSIS — Z1389 Encounter for screening for other disorder: Secondary | ICD-10-CM | POA: Diagnosis not present

## 2018-07-15 DIAGNOSIS — G894 Chronic pain syndrome: Secondary | ICD-10-CM | POA: Diagnosis not present

## 2018-07-15 DIAGNOSIS — E7849 Other hyperlipidemia: Secondary | ICD-10-CM | POA: Diagnosis not present

## 2018-07-15 DIAGNOSIS — R7309 Other abnormal glucose: Secondary | ICD-10-CM | POA: Diagnosis not present

## 2018-07-15 DIAGNOSIS — Z6841 Body Mass Index (BMI) 40.0 and over, adult: Secondary | ICD-10-CM | POA: Diagnosis not present

## 2018-07-15 DIAGNOSIS — I10 Essential (primary) hypertension: Secondary | ICD-10-CM | POA: Diagnosis not present

## 2018-07-15 DIAGNOSIS — Z0000 Encounter for general adult medical examination without abnormal findings: Secondary | ICD-10-CM | POA: Diagnosis not present

## 2018-07-30 ENCOUNTER — Ambulatory Visit: Payer: Medicare Other | Admitting: Neurology

## 2018-08-03 DIAGNOSIS — R809 Proteinuria, unspecified: Secondary | ICD-10-CM | POA: Diagnosis not present

## 2018-08-03 DIAGNOSIS — Z79899 Other long term (current) drug therapy: Secondary | ICD-10-CM | POA: Diagnosis not present

## 2018-08-03 DIAGNOSIS — N183 Chronic kidney disease, stage 3 (moderate): Secondary | ICD-10-CM | POA: Diagnosis not present

## 2018-08-03 DIAGNOSIS — I129 Hypertensive chronic kidney disease with stage 1 through stage 4 chronic kidney disease, or unspecified chronic kidney disease: Secondary | ICD-10-CM | POA: Diagnosis not present

## 2018-08-03 DIAGNOSIS — E559 Vitamin D deficiency, unspecified: Secondary | ICD-10-CM | POA: Diagnosis not present

## 2018-08-03 DIAGNOSIS — D509 Iron deficiency anemia, unspecified: Secondary | ICD-10-CM | POA: Diagnosis not present

## 2018-08-05 NOTE — Progress Notes (Deleted)
[facility] Clinic Note - Initial Visit   Date: 08/05/18  April Robles MRN: [id number] DOB: 1961-01-16   Dear Dr April Robles KitchenElsie Lincoln, MD:  Thank you for your kind referral of April Robles for consultation of muscle cramps. Although her history is well known to you, please allow [place] to reiterate it for the purpose of our medical record. The patient was accompanied to the clinic by *** who also provides collateral information.     History of Present Illness: April Robles is a 58 y.o. ***-handed female with depression, anxiety, hypertension, chronic pain presenting for evaluation of muscle cramps.     Out-side paper records, electronic medical record, and images have been reviewed where available and summarized as: *** Labs 05/17/2018: Sodium 137, potassium 4.4, glucose 108, creatinine 1.04, AST 19, ALT 14, magnesium 2.0 No results found for: HGBA1C Lab Results  Component Value Date   VITAMINB12 322 11/07/2011   No results found for: TSH No results found for: ESRSEDRATE, POCTSEDRATE  Past Medical History:  Diagnosis Date  . Automatic implantable cardiac defibrillator in situ 05/2011   2nd (since 2010)  . Automatic implantable cardiac defibrillator in situ   . Fibromyalgia   . Hiatal hernia   . History of shingles   . Hypertension   . ICD (implantable cardiac defibrillator) in place   . Pacemaker 05/2011  . Panic attack   . Ulcer disease    remote in 84s    Past Surgical History:  Procedure Laterality Date  . CHOLECYSTECTOMY  1997  . COLONOSCOPY  11/17/2011   TIW:PYKD sessile polyps ranging between 3-57mm in size were found in the ascending colon, at the cecum, in the descending colon, and sigmoid colon/(tubular adenoma.hyperplastic)Mild diverticulosis/Moderate sized internal hemorrhoids  . defibrillator/pacemaker  05/2011   replaced old one  . ESOPHAGOGASTRODUODENOSCOPY  11/17/2011   SLF:Non-erosive gastritis (inflammation) was  found/Negative H pylori. The mucosa of the esophagus appeared normal, Benign SB bx  . GIVENS CAPSULE STUDY  12/23/2011   Fields: small bowel AVMs  . KNEE ARTHROSCOPY     both knees  . TUBAL LIGATION  1997     Medications:  Outpatient Encounter Medications as of 08/09/2018  Medication Sig  . acetaminophen (TYLENOL) 500 MG tablet Take 1,000 mg by mouth every 6 (six) hours as needed. Pain.  April Robles Kitchen acyclovir (ZOVIRAX) 800 MG tablet Take 800 mg by mouth daily as needed. Shingles.  . ALPRAZolam (XANAX) 0.5 MG tablet Take 0.5 mg by mouth at bedtime as needed. anxiety  . furosemide (LASIX) 20 MG tablet Take 20 mg by mouth daily as needed. Fluid retention  . HYDROcodone-acetaminophen (NORCO) 7.5-325 MG per tablet Take 1 tablet by mouth every 6 (six) hours as needed. Pain  . hydrocortisone (ANUSOL-HC) 25 MG suppository Place 1 suppository (25 mg total) rectally every 12 (twelve) hours. For 12 days  . ibuprofen (ADVIL,MOTRIN) 200 MG tablet Take 200 mg by mouth every 6 (six) hours as needed. Pain.  April Robles Kitchen losartan (COZAAR) 50 MG tablet Take 50 mg by mouth daily.   April Robles Kitchen omeprazole (PRILOSEC) 20 MG capsule Take 20 mg by mouth daily.    No facility-administered encounter medications on file as of 08/09/2018.     Allergies:  Allergies  Allergen Reactions  . Cymbalta [Duloxetine Hcl] Palpitations    Family History: Family History  Problem Relation Age of Onset  . Alcohol abuse Father   . Coronary artery disease Father   . Diabetes Father   . Stroke Mother   .  Hypertension Sister   . Colon cancer Neg Hx     Social History: Social History   Tobacco Use  . Smoking status: Current Every Day Smoker    Packs/day: 0.20    Years: 20.00    Pack years: 4.00    Types: Cigarettes  Substance Use Topics  . Alcohol use: No  . Drug use: No   Social History   Social History Narrative   Lives w/ daughter          Review of Systems:  CONSTITUTIONAL: No fevers, chills, night sweats, or weight loss.  ***  EYES: No visual changes or eye pain ENT: No hearing changes.  No history of nose bleeds.   RESPIRATORY: No cough, wheezing and shortness of breath.   CARDIOVASCULAR: Negative for chest pain, and palpitations.   GI: Negative for abdominal discomfort, blood in stools or black stools.  No recent change in bowel habits.   GU:  No history of incontinence.   MUSCLOSKELETAL: No history of joint pain or swelling.  No myalgias.   SKIN: Negative for lesions, rash, and itching.   HEMATOLOGY/ONCOLOGY: Negative for prolonged bleeding, bruising easily, and swollen nodes.  No history of cancer.   ENDOCRINE: Negative for cold or heat intolerance, polydipsia or goiter.   PSYCH:  ***depression or anxiety symptoms.   NEURO: As Above.   Vital Signs:  There were no vitals taken for this visit.   General Medical Exam:  *** General:  Well appearing, comfortable.   Eyes/ENT: see cranial nerve examination.   Neck:   No carotid bruits. Respiratory:  Clear to auscultation, good air entry bilaterally.   Cardiac:  Regular rate and rhythm, no murmur.   Extremities:  No deformities, edema, or skin discoloration.  Skin:  No rashes or lesions.  Neurological Exam: MENTAL STATUS including orientation to time, place, person, recent and remote memory, attention span and concentration, language, and fund of knowledge is ***normal.  Speech is not dysarthric.  CRANIAL NERVES: II:  No visual field defects.  Unremarkable fundi.   III-IV-VI: Pupils equal round and reactive to light.  Normal conjugate, extra-ocular eye movements in all directions of gaze.  No nystagmus.  No ptosis***.   V:  Normal facial sensation.    VII:  Normal facial symmetry and movements.   VIII:  Normal hearing and vestibular function.   IX-X:  Normal palatal movement.   XI:  Normal shoulder shrug and head rotation.   XII:  Normal tongue strength and range of motion, no deviation or fasciculation.  MOTOR:  No atrophy, fasciculations or abnormal  movements.  No pronator drift.   Upper Extremity:  Right  Left  Deltoid  5/5   5/5   Biceps  5/5   5/5   Triceps  5/5   5/5   Infraspinatus 5/5  5/5  Medial pectoralis 5/5  5/5  Wrist extensors  5/5   5/5   Wrist flexors  5/5   5/5   Finger extensors  5/5   5/5   Finger flexors  5/5   5/5   Dorsal interossei  5/5   5/5   Abductor pollicis  5/5   5/5   Tone (Ashworth scale)  0  0   Lower Extremity:  Right  Left  Hip flexors  5/5   5/5   Hip extensors  5/5   5/5   Adductor 5/5  5/5  Abductor 5/5  5/5  Knee flexors  5/5   5/5  Knee extensors  5/5   5/5   Dorsiflexors  5/5   5/5   Plantarflexors  5/5   5/5   Toe extensors  5/5   5/5   Toe flexors  5/5   5/5   Tone (Ashworth scale)  0  0   MSRs:  Right        Left                  brachioradialis 2+  2+  biceps 2+  2+  triceps 2+  2+  patellar 2+  2+  ankle jerk 2+  2+  Hoffman no  no  plantar response down  down   SENSORY:  Normal and symmetric perception of light touch, pinprick, vibration, and proprioception.  Romberg's sign absent.   COORDINATION/GAIT: Normal finger-to- nose-finger and heel-to-shin.  Intact rapid alternating movements bilaterally.  Able to rise from a chair without using arms.  Gait narrow based and stable. Tandem and stressed gait intact.    IMPRESSION: ***  PLAN/RECOMMENDATIONS:  *** Return to clinic in *** months.    Thank you for allowing me to participate in patient's care.  If I can answer any additional questions, I would be pleased to do so.    Sincerely,    Elainna Eshleman K. Posey Pronto, DO

## 2018-08-09 ENCOUNTER — Ambulatory Visit: Payer: Medicare Other | Admitting: Neurology

## 2018-08-11 DIAGNOSIS — G894 Chronic pain syndrome: Secondary | ICD-10-CM | POA: Diagnosis not present

## 2018-08-11 DIAGNOSIS — Z6841 Body Mass Index (BMI) 40.0 and over, adult: Secondary | ICD-10-CM | POA: Diagnosis not present

## 2018-08-11 DIAGNOSIS — M7711 Lateral epicondylitis, right elbow: Secondary | ICD-10-CM | POA: Diagnosis not present

## 2018-09-13 DIAGNOSIS — G894 Chronic pain syndrome: Secondary | ICD-10-CM | POA: Diagnosis not present

## 2018-09-13 DIAGNOSIS — Z6841 Body Mass Index (BMI) 40.0 and over, adult: Secondary | ICD-10-CM | POA: Diagnosis not present

## 2018-09-13 DIAGNOSIS — M7711 Lateral epicondylitis, right elbow: Secondary | ICD-10-CM | POA: Diagnosis not present

## 2018-09-28 ENCOUNTER — Other Ambulatory Visit: Payer: Self-pay

## 2018-09-28 DIAGNOSIS — Z20822 Contact with and (suspected) exposure to covid-19: Secondary | ICD-10-CM

## 2018-09-30 LAB — NOVEL CORONAVIRUS, NAA: SARS-CoV-2, NAA: NOT DETECTED

## 2018-10-01 ENCOUNTER — Encounter: Payer: Self-pay | Admitting: Neurology

## 2018-10-04 ENCOUNTER — Ambulatory Visit (INDEPENDENT_AMBULATORY_CARE_PROVIDER_SITE_OTHER): Payer: Medicare Other | Admitting: Neurology

## 2018-10-04 DIAGNOSIS — Z5329 Procedure and treatment not carried out because of patient's decision for other reasons: Secondary | ICD-10-CM

## 2018-10-05 NOTE — Progress Notes (Signed)
Rescheduled appointment

## 2018-10-14 DIAGNOSIS — R252 Cramp and spasm: Secondary | ICD-10-CM | POA: Diagnosis not present

## 2018-10-14 DIAGNOSIS — G8929 Other chronic pain: Secondary | ICD-10-CM | POA: Diagnosis not present

## 2018-10-14 DIAGNOSIS — M159 Polyosteoarthritis, unspecified: Secondary | ICD-10-CM | POA: Diagnosis not present

## 2018-10-14 DIAGNOSIS — Z6841 Body Mass Index (BMI) 40.0 and over, adult: Secondary | ICD-10-CM | POA: Diagnosis not present

## 2018-10-22 ENCOUNTER — Ambulatory Visit (INDEPENDENT_AMBULATORY_CARE_PROVIDER_SITE_OTHER): Payer: Medicare Other | Admitting: Neurology

## 2018-10-22 ENCOUNTER — Ambulatory Visit: Payer: Medicare Other | Admitting: Neurology

## 2018-10-22 DIAGNOSIS — Z5329 Procedure and treatment not carried out because of patient's decision for other reasons: Secondary | ICD-10-CM

## 2018-10-25 NOTE — Progress Notes (Signed)
No show

## 2018-11-11 DIAGNOSIS — J019 Acute sinusitis, unspecified: Secondary | ICD-10-CM | POA: Diagnosis not present

## 2018-11-11 DIAGNOSIS — G894 Chronic pain syndrome: Secondary | ICD-10-CM | POA: Diagnosis not present

## 2018-11-11 DIAGNOSIS — Z6841 Body Mass Index (BMI) 40.0 and over, adult: Secondary | ICD-10-CM | POA: Diagnosis not present

## 2018-11-19 ENCOUNTER — Ambulatory Visit: Payer: Medicare Other | Admitting: Neurology

## 2018-11-22 DIAGNOSIS — Z888 Allergy status to other drugs, medicaments and biological substances status: Secondary | ICD-10-CM | POA: Diagnosis not present

## 2018-11-22 DIAGNOSIS — M1990 Unspecified osteoarthritis, unspecified site: Secondary | ICD-10-CM | POA: Diagnosis not present

## 2018-11-22 DIAGNOSIS — K219 Gastro-esophageal reflux disease without esophagitis: Secondary | ICD-10-CM | POA: Diagnosis not present

## 2018-11-22 DIAGNOSIS — R5381 Other malaise: Secondary | ICD-10-CM | POA: Diagnosis not present

## 2018-11-22 DIAGNOSIS — Z79899 Other long term (current) drug therapy: Secondary | ICD-10-CM | POA: Diagnosis not present

## 2018-11-22 DIAGNOSIS — F17200 Nicotine dependence, unspecified, uncomplicated: Secondary | ICD-10-CM | POA: Diagnosis not present

## 2018-11-22 DIAGNOSIS — Z8249 Family history of ischemic heart disease and other diseases of the circulatory system: Secondary | ICD-10-CM | POA: Diagnosis not present

## 2018-11-22 DIAGNOSIS — I10 Essential (primary) hypertension: Secondary | ICD-10-CM | POA: Diagnosis not present

## 2018-11-22 DIAGNOSIS — M79671 Pain in right foot: Secondary | ICD-10-CM | POA: Diagnosis not present

## 2018-11-22 DIAGNOSIS — R252 Cramp and spasm: Secondary | ICD-10-CM | POA: Diagnosis not present

## 2018-12-03 DIAGNOSIS — N183 Chronic kidney disease, stage 3 unspecified: Secondary | ICD-10-CM | POA: Diagnosis not present

## 2018-12-03 DIAGNOSIS — E7849 Other hyperlipidemia: Secondary | ICD-10-CM | POA: Diagnosis not present

## 2018-12-03 DIAGNOSIS — I129 Hypertensive chronic kidney disease with stage 1 through stage 4 chronic kidney disease, or unspecified chronic kidney disease: Secondary | ICD-10-CM | POA: Diagnosis not present

## 2018-12-05 DIAGNOSIS — R52 Pain, unspecified: Secondary | ICD-10-CM | POA: Diagnosis not present

## 2018-12-05 DIAGNOSIS — R252 Cramp and spasm: Secondary | ICD-10-CM | POA: Diagnosis not present

## 2018-12-05 DIAGNOSIS — I10 Essential (primary) hypertension: Secondary | ICD-10-CM | POA: Diagnosis not present

## 2018-12-05 DIAGNOSIS — R7989 Other specified abnormal findings of blood chemistry: Secondary | ICD-10-CM | POA: Diagnosis not present

## 2018-12-05 DIAGNOSIS — Z79899 Other long term (current) drug therapy: Secondary | ICD-10-CM | POA: Diagnosis not present

## 2018-12-05 DIAGNOSIS — F17200 Nicotine dependence, unspecified, uncomplicated: Secondary | ICD-10-CM | POA: Diagnosis not present

## 2018-12-06 ENCOUNTER — Ambulatory Visit: Payer: Medicare Other | Admitting: Neurology

## 2018-12-13 DIAGNOSIS — M159 Polyosteoarthritis, unspecified: Secondary | ICD-10-CM | POA: Diagnosis not present

## 2018-12-13 DIAGNOSIS — G894 Chronic pain syndrome: Secondary | ICD-10-CM | POA: Diagnosis not present

## 2018-12-13 DIAGNOSIS — Z23 Encounter for immunization: Secondary | ICD-10-CM | POA: Diagnosis not present

## 2018-12-13 DIAGNOSIS — R252 Cramp and spasm: Secondary | ICD-10-CM | POA: Diagnosis not present

## 2018-12-13 DIAGNOSIS — Z6841 Body Mass Index (BMI) 40.0 and over, adult: Secondary | ICD-10-CM | POA: Diagnosis not present

## 2018-12-23 DIAGNOSIS — M791 Myalgia, unspecified site: Secondary | ICD-10-CM | POA: Diagnosis not present

## 2018-12-23 DIAGNOSIS — M1990 Unspecified osteoarthritis, unspecified site: Secondary | ICD-10-CM | POA: Diagnosis not present

## 2018-12-23 DIAGNOSIS — R252 Cramp and spasm: Secondary | ICD-10-CM | POA: Diagnosis not present

## 2019-01-03 DIAGNOSIS — N183 Chronic kidney disease, stage 3 unspecified: Secondary | ICD-10-CM | POA: Diagnosis not present

## 2019-01-03 DIAGNOSIS — E7849 Other hyperlipidemia: Secondary | ICD-10-CM | POA: Diagnosis not present

## 2019-01-03 DIAGNOSIS — I129 Hypertensive chronic kidney disease with stage 1 through stage 4 chronic kidney disease, or unspecified chronic kidney disease: Secondary | ICD-10-CM | POA: Diagnosis not present

## 2019-01-12 DIAGNOSIS — R7309 Other abnormal glucose: Secondary | ICD-10-CM | POA: Diagnosis not present

## 2019-01-12 DIAGNOSIS — G894 Chronic pain syndrome: Secondary | ICD-10-CM | POA: Diagnosis not present

## 2019-01-12 DIAGNOSIS — Z6841 Body Mass Index (BMI) 40.0 and over, adult: Secondary | ICD-10-CM | POA: Diagnosis not present

## 2019-01-12 DIAGNOSIS — M159 Polyosteoarthritis, unspecified: Secondary | ICD-10-CM | POA: Diagnosis not present

## 2019-01-12 DIAGNOSIS — E119 Type 2 diabetes mellitus without complications: Secondary | ICD-10-CM | POA: Diagnosis not present

## 2019-02-03 DIAGNOSIS — F4542 Pain disorder with related psychological factors: Secondary | ICD-10-CM | POA: Diagnosis not present

## 2019-02-03 DIAGNOSIS — G894 Chronic pain syndrome: Secondary | ICD-10-CM | POA: Diagnosis not present

## 2019-02-03 DIAGNOSIS — N183 Chronic kidney disease, stage 3 unspecified: Secondary | ICD-10-CM | POA: Diagnosis not present

## 2019-02-03 DIAGNOSIS — I129 Hypertensive chronic kidney disease with stage 1 through stage 4 chronic kidney disease, or unspecified chronic kidney disease: Secondary | ICD-10-CM | POA: Diagnosis not present

## 2019-03-01 DIAGNOSIS — J22 Unspecified acute lower respiratory infection: Secondary | ICD-10-CM | POA: Diagnosis not present

## 2019-03-15 DIAGNOSIS — M1991 Primary osteoarthritis, unspecified site: Secondary | ICD-10-CM | POA: Diagnosis not present

## 2019-03-15 DIAGNOSIS — G894 Chronic pain syndrome: Secondary | ICD-10-CM | POA: Diagnosis not present

## 2019-03-15 DIAGNOSIS — I10 Essential (primary) hypertension: Secondary | ICD-10-CM | POA: Diagnosis not present

## 2019-03-15 DIAGNOSIS — K219 Gastro-esophageal reflux disease without esophagitis: Secondary | ICD-10-CM | POA: Diagnosis not present

## 2019-04-11 DIAGNOSIS — H524 Presbyopia: Secondary | ICD-10-CM | POA: Diagnosis not present

## 2019-04-11 DIAGNOSIS — H52223 Regular astigmatism, bilateral: Secondary | ICD-10-CM | POA: Diagnosis not present

## 2019-04-11 DIAGNOSIS — H5203 Hypermetropia, bilateral: Secondary | ICD-10-CM | POA: Diagnosis not present

## 2019-04-11 DIAGNOSIS — E119 Type 2 diabetes mellitus without complications: Secondary | ICD-10-CM | POA: Diagnosis not present

## 2019-04-12 DIAGNOSIS — G894 Chronic pain syndrome: Secondary | ICD-10-CM | POA: Diagnosis not present

## 2019-04-12 DIAGNOSIS — R609 Edema, unspecified: Secondary | ICD-10-CM | POA: Diagnosis not present

## 2019-04-12 DIAGNOSIS — M1712 Unilateral primary osteoarthritis, left knee: Secondary | ICD-10-CM | POA: Diagnosis not present

## 2019-04-12 DIAGNOSIS — B009 Herpesviral infection, unspecified: Secondary | ICD-10-CM | POA: Diagnosis not present

## 2019-05-31 DIAGNOSIS — Z23 Encounter for immunization: Secondary | ICD-10-CM | POA: Diagnosis not present

## 2019-06-28 DIAGNOSIS — Z23 Encounter for immunization: Secondary | ICD-10-CM | POA: Diagnosis not present

## 2019-10-24 IMAGING — DX DG KNEE COMPLETE 4+V*L*
4 series · 4 of 4 positions shown · non-contrast
Comparison: Knee series of January 05, 2014

CLINICAL DATA: Left knee pain and intermittent locking. History of
knee surgery.

EXAM:
LEFT KNEE - COMPLETE 4+ VIEW

[knee ap (1 of 3)]
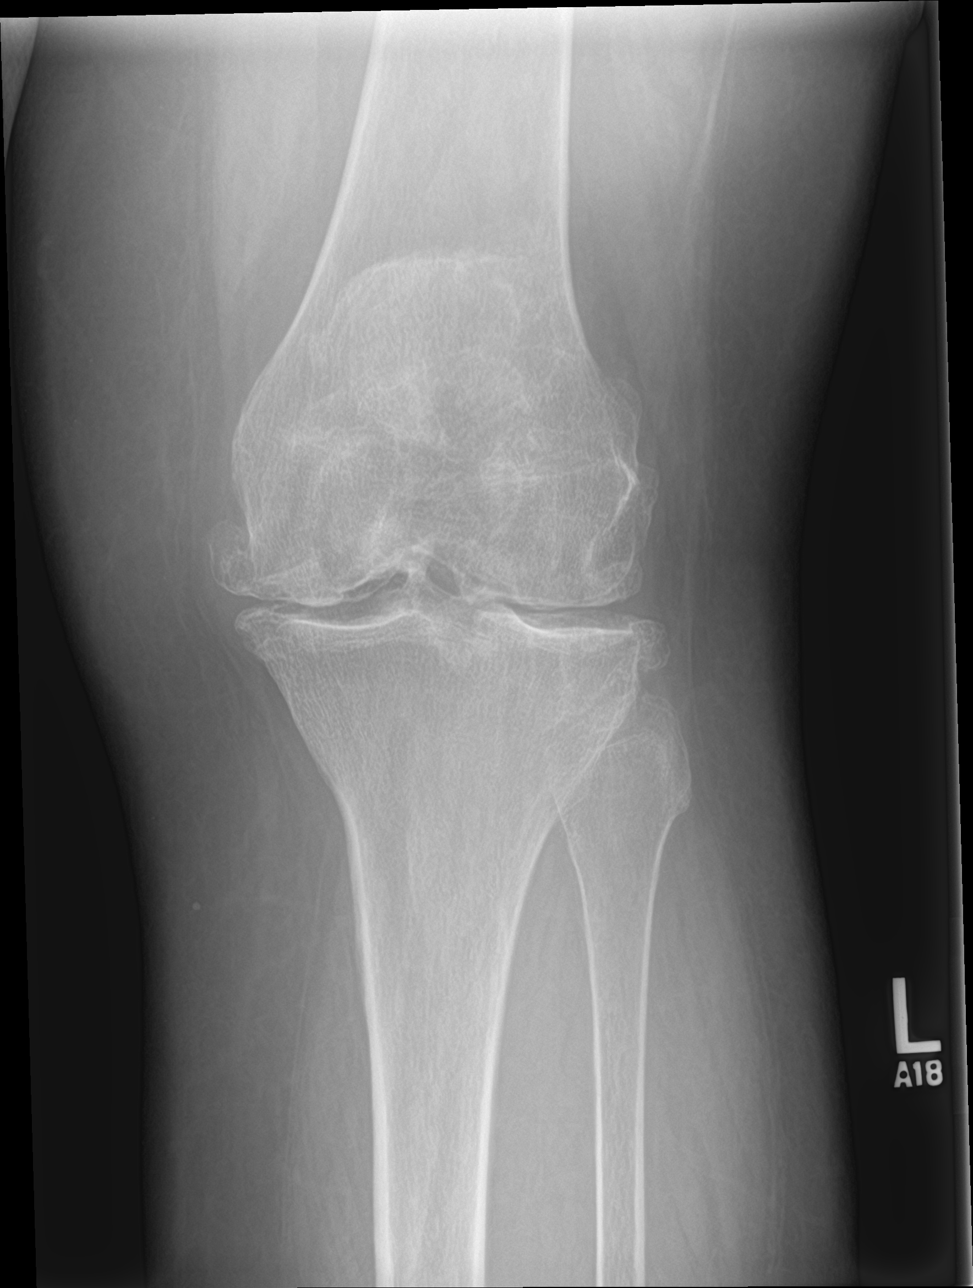

[knee lat]
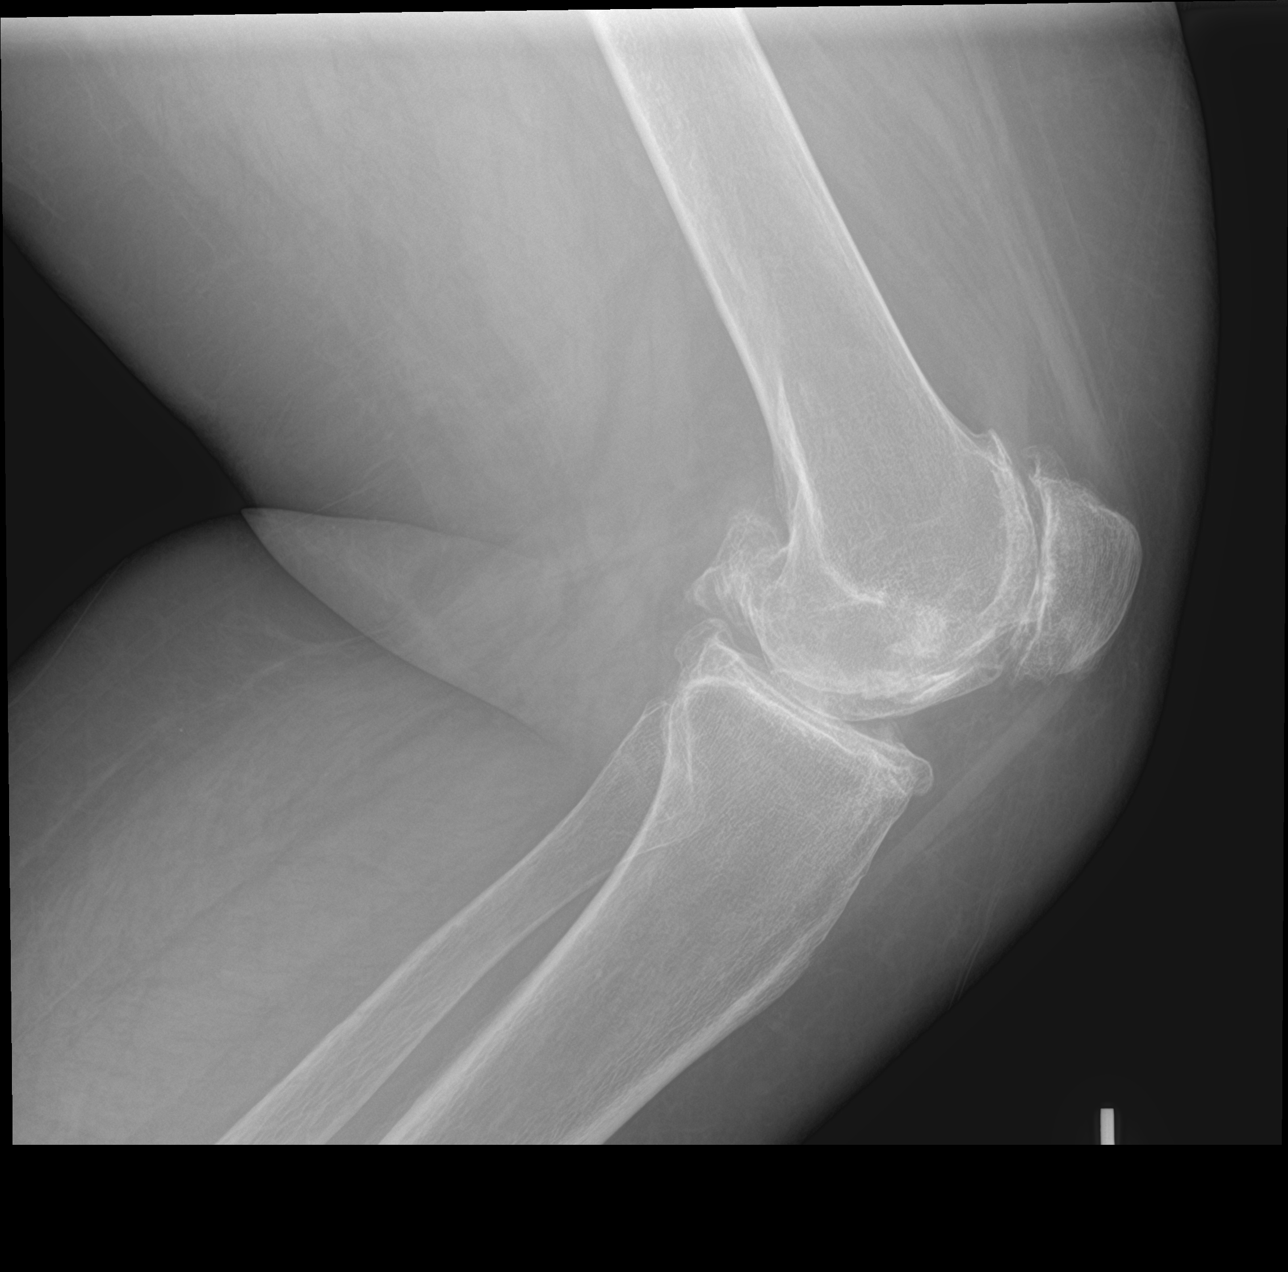

[knee ap (2 of 3)]
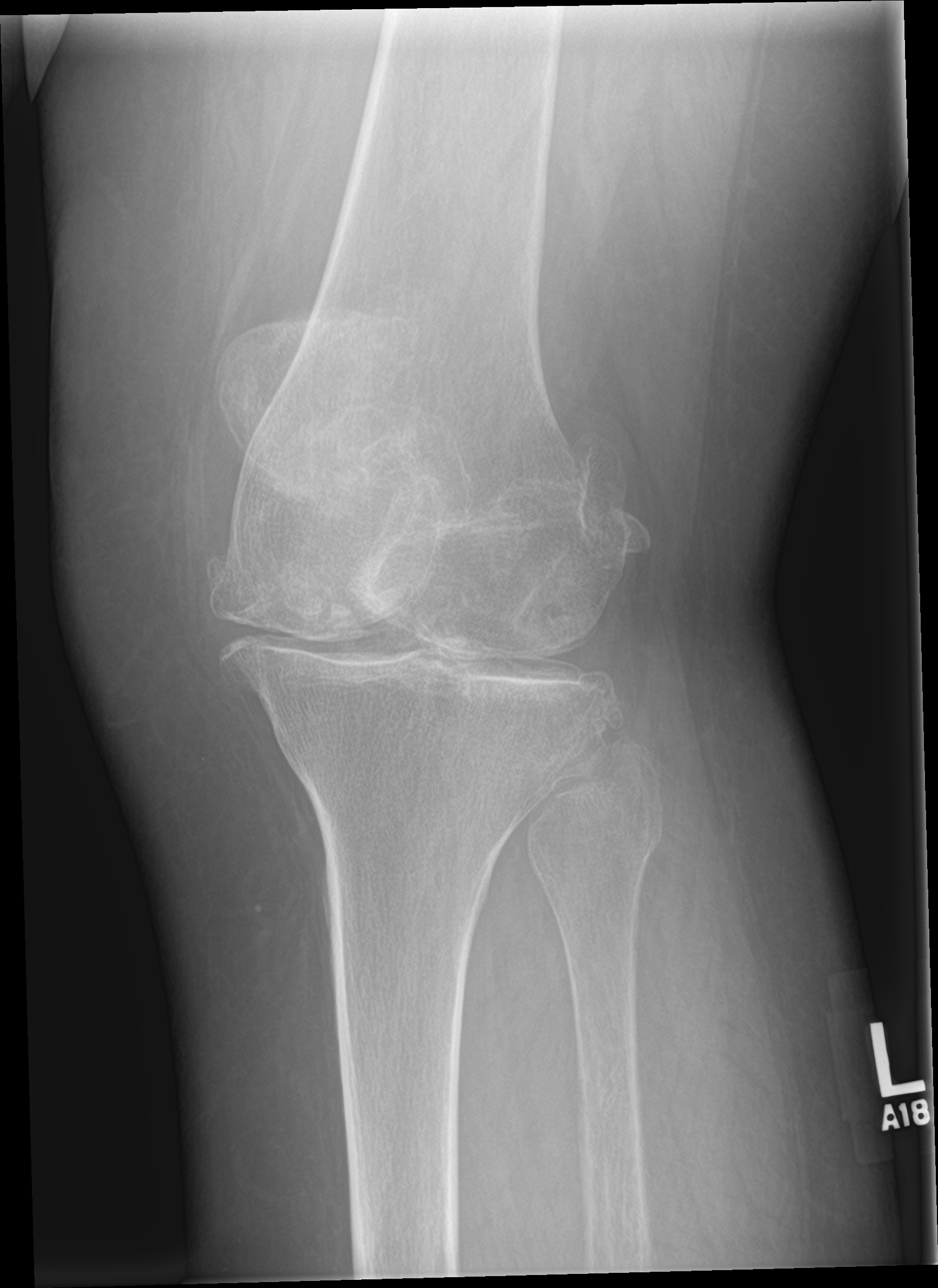

[knee ap (3 of 3)]
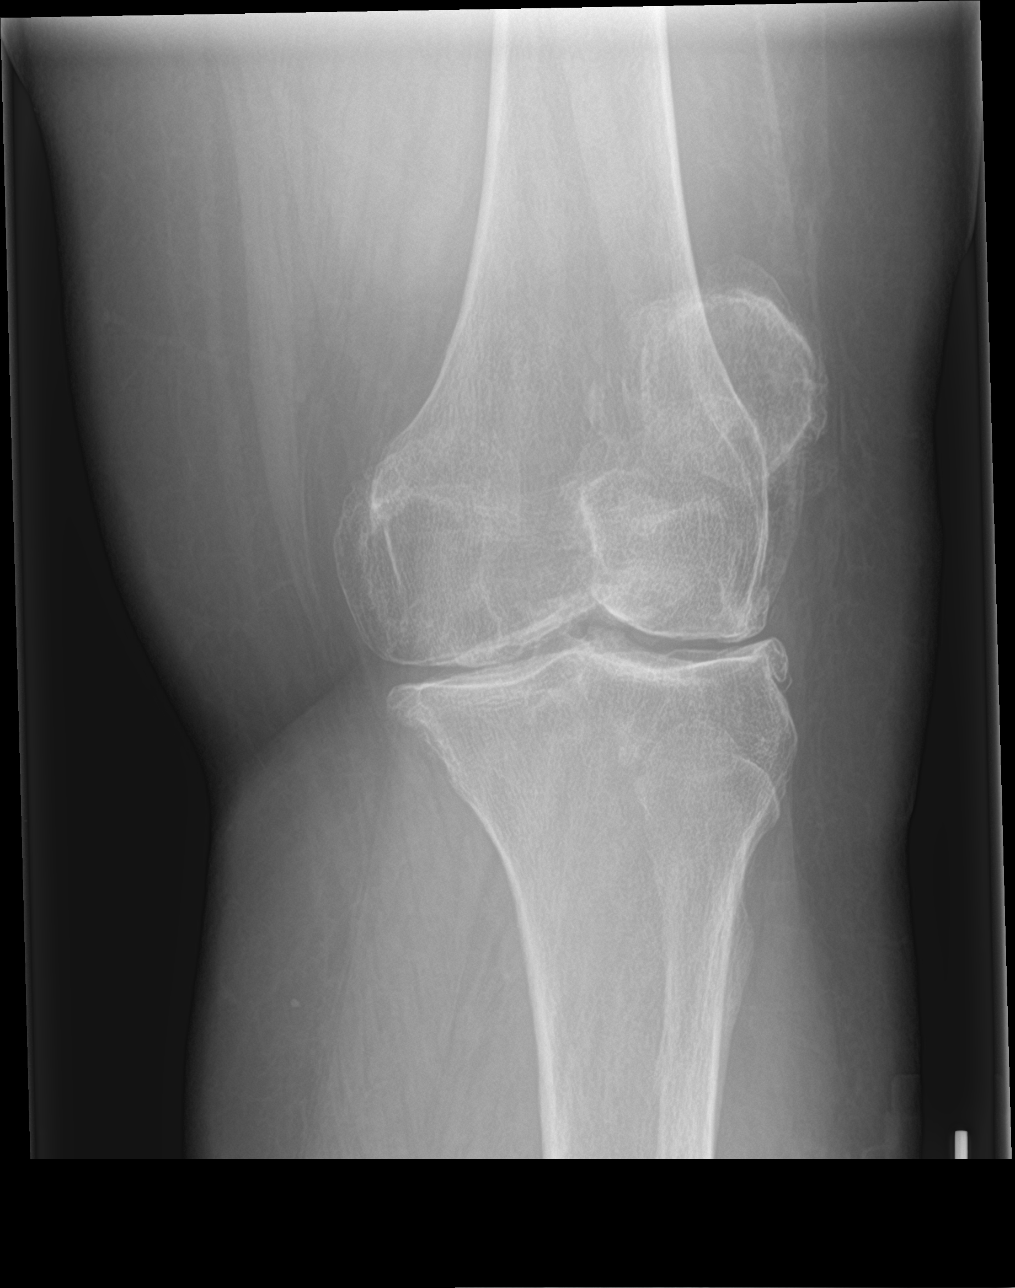

[4 of 4 positions shown; findings below may reference images not displayed]

FINDINGS: The bones are subjectively adequately mineralized. There is moderate
narrowing of the medial and lateral and patellofemoral joint
compartments. There is exuberant osteophyte formation. There is no
acute or healing fracture. There is no joint effusion.
IMPRESSION: Moderate tricompartmental osteoarthritic change with prominent
spurs. No acute bony abnormality.

## 2019-12-20 DIAGNOSIS — Z23 Encounter for immunization: Secondary | ICD-10-CM | POA: Diagnosis not present

## 2021-08-05 DIAGNOSIS — E782 Mixed hyperlipidemia: Secondary | ICD-10-CM | POA: Diagnosis not present

## 2021-08-05 DIAGNOSIS — H5203 Hypermetropia, bilateral: Secondary | ICD-10-CM | POA: Diagnosis not present

## 2021-08-05 DIAGNOSIS — N1832 Chronic kidney disease, stage 3b: Secondary | ICD-10-CM | POA: Diagnosis not present

## 2021-08-05 DIAGNOSIS — H52223 Regular astigmatism, bilateral: Secondary | ICD-10-CM | POA: Diagnosis not present

## 2021-08-05 DIAGNOSIS — I129 Hypertensive chronic kidney disease with stage 1 through stage 4 chronic kidney disease, or unspecified chronic kidney disease: Secondary | ICD-10-CM | POA: Diagnosis not present

## 2021-08-05 DIAGNOSIS — R779 Abnormality of plasma protein, unspecified: Secondary | ICD-10-CM | POA: Diagnosis not present

## 2021-08-05 DIAGNOSIS — Z0001 Encounter for general adult medical examination with abnormal findings: Secondary | ICD-10-CM | POA: Diagnosis not present

## 2021-08-05 DIAGNOSIS — E119 Type 2 diabetes mellitus without complications: Secondary | ICD-10-CM | POA: Diagnosis not present

## 2021-08-05 DIAGNOSIS — I10 Essential (primary) hypertension: Secondary | ICD-10-CM | POA: Diagnosis not present

## 2021-09-05 DIAGNOSIS — Z0001 Encounter for general adult medical examination with abnormal findings: Secondary | ICD-10-CM | POA: Diagnosis not present

## 2021-09-05 DIAGNOSIS — N1832 Chronic kidney disease, stage 3b: Secondary | ICD-10-CM | POA: Diagnosis not present

## 2021-09-05 DIAGNOSIS — E119 Type 2 diabetes mellitus without complications: Secondary | ICD-10-CM | POA: Diagnosis not present

## 2021-09-05 DIAGNOSIS — R52 Pain, unspecified: Secondary | ICD-10-CM | POA: Diagnosis not present

## 2021-09-05 DIAGNOSIS — I129 Hypertensive chronic kidney disease with stage 1 through stage 4 chronic kidney disease, or unspecified chronic kidney disease: Secondary | ICD-10-CM | POA: Diagnosis not present

## 2021-09-05 DIAGNOSIS — M159 Polyosteoarthritis, unspecified: Secondary | ICD-10-CM | POA: Diagnosis not present

## 2021-09-05 DIAGNOSIS — G894 Chronic pain syndrome: Secondary | ICD-10-CM | POA: Diagnosis not present

## 2021-09-05 DIAGNOSIS — M1712 Unilateral primary osteoarthritis, left knee: Secondary | ICD-10-CM | POA: Diagnosis not present

## 2021-09-05 DIAGNOSIS — I10 Essential (primary) hypertension: Secondary | ICD-10-CM | POA: Diagnosis not present

## 2021-09-05 DIAGNOSIS — M1711 Unilateral primary osteoarthritis, right knee: Secondary | ICD-10-CM | POA: Diagnosis not present

## 2021-10-02 DIAGNOSIS — Z7982 Long term (current) use of aspirin: Secondary | ICD-10-CM | POA: Diagnosis not present

## 2021-10-02 DIAGNOSIS — I2510 Atherosclerotic heart disease of native coronary artery without angina pectoris: Secondary | ICD-10-CM | POA: Diagnosis not present

## 2021-10-02 DIAGNOSIS — E119 Type 2 diabetes mellitus without complications: Secondary | ICD-10-CM | POA: Diagnosis not present

## 2021-10-02 DIAGNOSIS — I10 Essential (primary) hypertension: Secondary | ICD-10-CM | POA: Diagnosis not present

## 2021-10-02 DIAGNOSIS — M19011 Primary osteoarthritis, right shoulder: Secondary | ICD-10-CM | POA: Diagnosis not present

## 2021-10-02 DIAGNOSIS — Z79899 Other long term (current) drug therapy: Secondary | ICD-10-CM | POA: Diagnosis not present

## 2021-10-02 DIAGNOSIS — Z791 Long term (current) use of non-steroidal anti-inflammatories (NSAID): Secondary | ICD-10-CM | POA: Diagnosis not present

## 2021-10-02 DIAGNOSIS — M25511 Pain in right shoulder: Secondary | ICD-10-CM | POA: Diagnosis not present

## 2021-10-02 DIAGNOSIS — F17200 Nicotine dependence, unspecified, uncomplicated: Secondary | ICD-10-CM | POA: Diagnosis not present

## 2021-10-04 DIAGNOSIS — I129 Hypertensive chronic kidney disease with stage 1 through stage 4 chronic kidney disease, or unspecified chronic kidney disease: Secondary | ICD-10-CM | POA: Diagnosis not present

## 2021-10-04 DIAGNOSIS — M159 Polyosteoarthritis, unspecified: Secondary | ICD-10-CM | POA: Diagnosis not present

## 2021-10-04 DIAGNOSIS — I10 Essential (primary) hypertension: Secondary | ICD-10-CM | POA: Diagnosis not present

## 2021-10-04 DIAGNOSIS — G894 Chronic pain syndrome: Secondary | ICD-10-CM | POA: Diagnosis not present

## 2021-10-09 ENCOUNTER — Other Ambulatory Visit (HOSPITAL_COMMUNITY): Payer: Self-pay | Admitting: Internal Medicine

## 2021-10-09 ENCOUNTER — Other Ambulatory Visit: Payer: Self-pay | Admitting: Internal Medicine

## 2021-10-09 DIAGNOSIS — M25511 Pain in right shoulder: Secondary | ICD-10-CM

## 2021-10-21 ENCOUNTER — Encounter: Payer: Self-pay | Admitting: *Deleted

## 2021-11-05 DIAGNOSIS — G2581 Restless legs syndrome: Secondary | ICD-10-CM | POA: Diagnosis not present

## 2021-11-05 DIAGNOSIS — I129 Hypertensive chronic kidney disease with stage 1 through stage 4 chronic kidney disease, or unspecified chronic kidney disease: Secondary | ICD-10-CM | POA: Diagnosis not present

## 2021-11-05 DIAGNOSIS — I10 Essential (primary) hypertension: Secondary | ICD-10-CM | POA: Diagnosis not present

## 2021-11-05 DIAGNOSIS — G894 Chronic pain syndrome: Secondary | ICD-10-CM | POA: Diagnosis not present

## 2021-11-13 ENCOUNTER — Ambulatory Visit (HOSPITAL_COMMUNITY)
Admission: RE | Admit: 2021-11-13 | Discharge: 2021-11-13 | Disposition: A | Discharge status: Home / Self Care | Payer: Medicare Other | Source: Ambulatory Visit | Attending: Internal Medicine | Admitting: Internal Medicine

## 2021-11-13 DIAGNOSIS — M25511 Pain in right shoulder: Secondary | ICD-10-CM | POA: Insufficient documentation

## 2021-11-18 DIAGNOSIS — Z1231 Encounter for screening mammogram for malignant neoplasm of breast: Secondary | ICD-10-CM | POA: Diagnosis not present

## 2021-12-03 DIAGNOSIS — K219 Gastro-esophageal reflux disease without esophagitis: Secondary | ICD-10-CM | POA: Diagnosis not present

## 2021-12-03 DIAGNOSIS — I129 Hypertensive chronic kidney disease with stage 1 through stage 4 chronic kidney disease, or unspecified chronic kidney disease: Secondary | ICD-10-CM | POA: Diagnosis not present

## 2021-12-06 DIAGNOSIS — G894 Chronic pain syndrome: Secondary | ICD-10-CM | POA: Diagnosis not present

## 2021-12-06 DIAGNOSIS — M25511 Pain in right shoulder: Secondary | ICD-10-CM | POA: Diagnosis not present

## 2021-12-06 DIAGNOSIS — M1712 Unilateral primary osteoarthritis, left knee: Secondary | ICD-10-CM | POA: Diagnosis not present

## 2021-12-06 DIAGNOSIS — M159 Polyosteoarthritis, unspecified: Secondary | ICD-10-CM | POA: Diagnosis not present

## 2021-12-06 DIAGNOSIS — I129 Hypertensive chronic kidney disease with stage 1 through stage 4 chronic kidney disease, or unspecified chronic kidney disease: Secondary | ICD-10-CM | POA: Diagnosis not present

## 2021-12-06 DIAGNOSIS — I10 Essential (primary) hypertension: Secondary | ICD-10-CM | POA: Diagnosis not present

## 2022-01-02 DIAGNOSIS — I10 Essential (primary) hypertension: Secondary | ICD-10-CM | POA: Diagnosis not present

## 2022-01-02 DIAGNOSIS — G894 Chronic pain syndrome: Secondary | ICD-10-CM | POA: Diagnosis not present

## 2022-01-02 DIAGNOSIS — M159 Polyosteoarthritis, unspecified: Secondary | ICD-10-CM | POA: Diagnosis not present

## 2022-01-02 DIAGNOSIS — J209 Acute bronchitis, unspecified: Secondary | ICD-10-CM | POA: Diagnosis not present

## 2022-01-14 DIAGNOSIS — M19011 Primary osteoarthritis, right shoulder: Secondary | ICD-10-CM | POA: Diagnosis not present

## 2022-01-31 DIAGNOSIS — I129 Hypertensive chronic kidney disease with stage 1 through stage 4 chronic kidney disease, or unspecified chronic kidney disease: Secondary | ICD-10-CM | POA: Diagnosis not present

## 2022-01-31 DIAGNOSIS — G2581 Restless legs syndrome: Secondary | ICD-10-CM | POA: Diagnosis not present

## 2022-01-31 DIAGNOSIS — I10 Essential (primary) hypertension: Secondary | ICD-10-CM | POA: Diagnosis not present

## 2022-01-31 DIAGNOSIS — G4762 Sleep related leg cramps: Secondary | ICD-10-CM | POA: Diagnosis not present

## 2022-02-12 ENCOUNTER — Other Ambulatory Visit: Payer: Self-pay | Admitting: *Deleted

## 2022-02-12 ENCOUNTER — Encounter: Payer: Self-pay | Admitting: *Deleted

## 2022-02-12 NOTE — Patient Instructions (Signed)
  Procedure: Colonoscopy  Estimated body mass index is 40.18 kg/m as calculated from the following:   Height as of this encounter: '5\' 10"'$  (1.778 m).   Weight as of this encounter: 280 lb (127 kg).   Have you had a colonoscopy before?  Yes, 10 years ago  Do you have family history of colon cancer?  No  Do you have a family history of polyps? No  Previous colonoscopy with polyps removed? No  Do you have a history colorectal cancer?   No  Are you diabetic?  borderline  Do you have a prosthetic or mechanical heart valve? No  Do you have a pacemaker/defibrillator?   No  Have you had endocarditis/atrial fibrillation?  No  Do you use supplemental oxygen/CPAP?  No  Have you had joint replacement within the last 12 months?  No  Do you tend to be constipated or have to use laxatives?  No   Do you have history of alcohol use? If yes, how much and how often.  No  Do you have history or are you using drugs? If yes, what do are you  using?  No  Have you ever had a stroke/heart attack?  No  Have you ever had a heart or other vascular stent placed,?Loop recorder,was removed 5 years ago  Do you take weight loss medication? No  female patients,: have you had a hysterectomy? No                              are you post menopausal?  No                              do you still have your menstrual cycle? No    Date of last menstrual period? 2 years ago  Do you take any blood-thinning medications such as: (Plavix, aspirin, Coumadin, Aggrenox, Brilinta, Xarelto, Eliquis, Pradaxa, Savaysa or Effient)? No  If yes we need the name, milligram, dosage and who is prescribing doctor:  n/a             Current Outpatient Medications  Medication Sig Dispense Refill   amLODipine (NORVASC) 5 MG tablet      busPIRone (BUSPAR) 5 MG tablet      cyclobenzaprine (FLEXERIL) 10 MG tablet Take 10 mg by mouth 3 (three) times daily as needed for muscle spasms.     HYDROcodone-acetaminophen (NORCO) 7.5-325  MG per tablet Take 1 tablet by mouth every 6 (six) hours as needed. Pain     losartan (COZAAR) 25 MG tablet      meloxicam (MOBIC) 15 MG tablet Take 15 mg by mouth daily.     omeprazole (PRILOSEC) 20 MG capsule Take 20 mg by mouth daily.      rOPINIRole (REQUIP) 1 MG tablet Take 1 mg by mouth at bedtime.     VENTOLIN HFA 108 (90 Base) MCG/ACT inhaler      No current facility-administered medications for this visit.    Allergies  Allergen Reactions   Cymbalta [Duloxetine Hcl] Palpitations

## 2022-02-19 NOTE — Progress Notes (Signed)
Please clarify with patient.   Does she have a pacemaker or defibrillator? Her PMH says she had both but not sure if erroneous entry.

## 2022-02-20 NOTE — Patient Instructions (Signed)
  Procedure: COLONOSCOPY  Estimated body mass index is 40.18 kg/m as calculated from the following:   Height as of this encounter: '5\' 10"'$  (1.778 m).   Weight as of this encounter: 280 lb (127 kg).   Have you had a colonoscopy before?  YES  Do you have family history of colon cancer?  NO  Do you have a family history of polyps? NO  Previous colonoscopy with polyps removed? NO  Do you have a history colorectal cancer?   NO  Are you diabetic?  BORDERLINE  Do you have a prosthetic or mechanical heart valve? NO  Do you have a pacemaker/defibrillator?   NO  Have you had endocarditis/atrial fibrillation?  NO  Do you use supplemental oxygen/CPAP?  NO  Have you had joint replacement within the last 12 months?  NO  Do you tend to be constipated or have to use laxatives?  NO   Do you have history of alcohol use? If yes, how much and how often.  NO  Do you have history or are you using drugs? If yes, what do are you  using?  NO  Have you ever had a stroke/heart attack?  NO  Have you ever had a heart or other vascular stent placed,?NO  Do you take weight loss medication? NO  female patients,: have you had a hysterectomy? NO                              are you post menopausal?  NO                              do you still have your menstrual cycle? NO    Date of last menstrual period? 2 YEARS AGO  Do you take any blood-thinning medications such as: (Plavix, aspirin, Coumadin, Aggrenox, Brilinta, Xarelto, Eliquis, Pradaxa, Savaysa or Effient)? NO  If yes we need the name, milligram, dosage and who is prescribing doctor:  N/A             Current Outpatient Medications  Medication Sig Dispense Refill   amLODipine (NORVASC) 5 MG tablet      busPIRone (BUSPAR) 5 MG tablet      cyclobenzaprine (FLEXERIL) 10 MG tablet Take 10 mg by mouth 3 (three) times daily as needed for muscle spasms.     HYDROcodone-acetaminophen (NORCO) 7.5-325 MG per tablet Take 1 tablet by mouth every 6  (six) hours as needed. Pain     losartan (COZAAR) 25 MG tablet      meloxicam (MOBIC) 15 MG tablet Take 15 mg by mouth daily.     omeprazole (PRILOSEC) 20 MG capsule Take 20 mg by mouth daily.      rOPINIRole (REQUIP) 1 MG tablet Take 1 mg by mouth at bedtime.     VENTOLIN HFA 108 (90 Base) MCG/ACT inhaler      No current facility-administered medications for this visit.    Allergies  Allergen Reactions   Duloxetine    Cymbalta [Duloxetine Hcl] Palpitations

## 2022-02-20 NOTE — Progress Notes (Signed)
Discussed with April Robles, patient denies implantable device.   OK to schedule. ASA 3 due to BMI >40.

## 2022-02-20 NOTE — Progress Notes (Signed)
Pt says she had it taken out 3-4 years ago.

## 2022-02-21 ENCOUNTER — Encounter: Payer: Self-pay | Admitting: *Deleted

## 2022-02-21 MED ORDER — PEG 3350-KCL-NA BICARB-NACL 420 G PO SOLR: 4000.0000 mL | Freq: Once | ORAL | 0 refills | Status: AC

## 2022-02-21 NOTE — Progress Notes (Signed)
Pt has been scheduled for 03/21/22 with Dr.Carver. Instructions mailed and prep sent to the pharmacy

## 2022-02-24 ENCOUNTER — Encounter: Payer: Self-pay | Admitting: *Deleted

## 2022-02-25 DIAGNOSIS — T50905A Adverse effect of unspecified drugs, medicaments and biological substances, initial encounter: Secondary | ICD-10-CM | POA: Diagnosis not present

## 2022-02-25 DIAGNOSIS — I129 Hypertensive chronic kidney disease with stage 1 through stage 4 chronic kidney disease, or unspecified chronic kidney disease: Secondary | ICD-10-CM | POA: Diagnosis not present

## 2022-02-25 DIAGNOSIS — M159 Polyosteoarthritis, unspecified: Secondary | ICD-10-CM | POA: Diagnosis not present

## 2022-02-25 DIAGNOSIS — E1129 Type 2 diabetes mellitus with other diabetic kidney complication: Secondary | ICD-10-CM | POA: Diagnosis not present

## 2022-02-25 DIAGNOSIS — N1832 Chronic kidney disease, stage 3b: Secondary | ICD-10-CM | POA: Diagnosis not present

## 2022-02-25 DIAGNOSIS — I10 Essential (primary) hypertension: Secondary | ICD-10-CM | POA: Diagnosis not present

## 2022-02-25 DIAGNOSIS — R7309 Other abnormal glucose: Secondary | ICD-10-CM | POA: Diagnosis not present

## 2022-03-06 NOTE — Progress Notes (Signed)
Patient already has been triaged by leslie.

## 2022-03-12 NOTE — Progress Notes (Signed)
Pt has been scheduled for 03/21/22 with Dr.Carver. Instructions mailed and prep sent to the pharmacy

## 2022-03-18 NOTE — Patient Instructions (Signed)
   Your procedure is scheduled on: 03/21/2022  Report to [place] Entr[facility] at    6:40 AM.  Call this number if you have problems the morning of surgery: [phone]   Remember:              Follow Directions on the letter you received from Your Physician's office regarding the Bowel Prep              No Smoking the day of Procedure :   Take these medicines the morning of surgery with A SIP OF WATER: Amlodipine, omeprazole, and hydrocodone if needed  Use inhalers if needed   Do not wear jewelry, make-up or nail polish.    Do not bring valuables to the hospital.  Contacts, dentures or bridgework may not be worn into surgery.  .   Patients discharged the day of surgery will not be allowed to drive home.     Colonoscopy, Adult, Care After This sheet gives you information about how to care for yourself after your procedure. Your health care provider may also give you more specific instructions. If you have problems or questions, contact your health care provider. What can I expect after the procedure? After the procedure, it is common to have: A small amount of blood in your stool for 24 hours after the procedure. Some gas. Mild abdominal cramping or bloating.  Follow these instructions at home: General instructions  For the first 24 hours after the procedure: Do not drive or use machinery. Do not sign important documents. Do not drink alcohol. Do your regular daily activities at a slower pace than normal. Eat soft, easy-to-digest foods. Rest often. Take over-the-counter or prescription medicines only as told by your health care provider. It is up to you to get the results of your procedure. Ask your health care provider, or the department performing the procedure, when your results will be ready. Relieving cramping and bloating Try walking around when you have cramps or feel bloated. Apply heat to your abdomen as told by your health care provider. Use a heat source  that your health care provider recommends, such as a moist heat pack or a heating pad. Place a towel between your skin and the heat source. Leave the heat on for 20-30 minutes. Remove the heat if your skin turns bright red. This is especially important if you are unable to feel pain, heat, or cold. You may have a greater risk of getting burned. Eating and drinking Drink enough fluid to keep your urine clear or pale yellow. Resume your normal diet as instructed by your health care provider. Avoid heavy or fried foods that are hard to digest. Avoid drinking alcohol for as long as instructed by your health care provider. Contact a health care provider if: You have blood in your stool 2-3 days after the procedure. Get help right away if: You have more than a small spotting of blood in your stool. You pass large blood clots in your stool. Your abdomen is swollen. You have nausea or vomiting. You have a fever. You have increasing abdominal pain that is not relieved with medicine. This information is not intended to replace advice given to you by your health care provider. Make sure you discuss any questions you have with your health care provider. Document Released: 09/04/2003 Document Revised: 10/15/2015 Document Reviewed: 04/03/2015 Elsevier Interactive Patient Education  [employer].

## 2022-03-19 ENCOUNTER — Encounter (HOSPITAL_COMMUNITY)
Admission: RE | Admit: 2022-03-19 | Discharge: 2022-03-19 | Disposition: A | Discharge status: Home / Self Care | Payer: 59 | Source: Ambulatory Visit | Attending: Internal Medicine | Admitting: Internal Medicine

## 2022-03-19 ENCOUNTER — Encounter (HOSPITAL_COMMUNITY): Payer: Self-pay | Admitting: *Deleted

## 2022-03-19 VITALS — BP 132/90 | HR 115 | Temp 97.6°F | Resp 18 | Ht 70.0 in | Wt 280.0 lb

## 2022-03-19 DIAGNOSIS — I10 Essential (primary) hypertension: Secondary | ICD-10-CM | POA: Diagnosis not present

## 2022-03-19 DIAGNOSIS — Z01818 Encounter for other preprocedural examination: Secondary | ICD-10-CM | POA: Insufficient documentation

## 2022-03-19 DIAGNOSIS — Z79899 Other long term (current) drug therapy: Secondary | ICD-10-CM | POA: Insufficient documentation

## 2022-03-19 LAB — BASIC METABOLIC PANEL
Anion gap: 12 (ref 5–15)
BUN: 16 mg/dL (ref 8–23)
CO2: 20 mmol/L — ABNORMAL LOW (ref 22–32)
Calcium: 8.9 mg/dL (ref 8.9–10.3)
Chloride: 103 mmol/L (ref 98–111)
Creatinine, Ser: 1.06 mg/dL — ABNORMAL HIGH (ref 0.44–1.00)
GFR, Estimated: 59 mL/min — ABNORMAL LOW (ref >60)
Glucose, Bld: 195 mg/dL — ABNORMAL HIGH (ref 70–99)
Potassium: 4.1 mmol/L (ref 3.5–5.1)
Sodium: 135 mmol/L (ref 135–145)

## 2022-03-19 NOTE — Progress Notes (Signed)
   03/19/22 1007  OBSTRUCTIVE SLEEP APNEA  Have you ever been diagnosed with sleep apnea through a sleep study? No  Do you snore loudly (loud enough to be heard through closed doors)?  1  Do you often feel tired, fatigued, or sleepy during the daytime (such as falling asleep during driving or talking to someone)? 0  Has anyone observed you stop breathing during your sleep? 1  Do you have, or are you being treated for high blood pressure? 1  BMI more than 35 kg/m2? 1  Age > 50 (1-yes) 1  Neck circumference greater than:Female 16 inches or larger, Female 17inches or larger? 1  Female Gender (Yes=1) 0  Obstructive Sleep Apnea Score 6

## 2022-03-19 NOTE — Progress Notes (Signed)
Dr Briant Cedar reviewed cardiac history,  Patient states they removed her pacemaker and ICD.  No orders given.

## 2022-03-19 NOTE — Progress Notes (Signed)
[employer] PA: APPROVED Authorization #: [id number] DOS: 03/21/22-06/19/22

## 2022-03-21 ENCOUNTER — Ambulatory Visit (HOSPITAL_COMMUNITY)
Admission: RE | Admit: 2022-03-21 | Discharge: 2022-03-21 | Disposition: A | Discharge status: Home / Self Care | Payer: 59 | Attending: Internal Medicine | Admitting: Internal Medicine

## 2022-03-21 ENCOUNTER — Encounter (HOSPITAL_COMMUNITY): Payer: Self-pay

## 2022-03-21 ENCOUNTER — Ambulatory Visit (HOSPITAL_BASED_OUTPATIENT_CLINIC_OR_DEPARTMENT_OTHER): Payer: 59 | Admitting: Certified Registered"

## 2022-03-21 ENCOUNTER — Encounter (HOSPITAL_COMMUNITY)
Admission: RE | Disposition: A | Discharge status: Home / Self Care | Payer: Self-pay | Source: Home / Self Care | Attending: Internal Medicine

## 2022-03-21 ENCOUNTER — Ambulatory Visit (HOSPITAL_COMMUNITY): Payer: 59 | Admitting: Certified Registered"

## 2022-03-21 DIAGNOSIS — D759 Disease of blood and blood-forming organs, unspecified: Secondary | ICD-10-CM | POA: Diagnosis not present

## 2022-03-21 DIAGNOSIS — K5730 Diverticulosis of large intestine without perforation or abscess without bleeding: Secondary | ICD-10-CM | POA: Insufficient documentation

## 2022-03-21 DIAGNOSIS — K219 Gastro-esophageal reflux disease without esophagitis: Secondary | ICD-10-CM | POA: Diagnosis not present

## 2022-03-21 DIAGNOSIS — I10 Essential (primary) hypertension: Secondary | ICD-10-CM | POA: Insufficient documentation

## 2022-03-21 DIAGNOSIS — K648 Other hemorrhoids: Secondary | ICD-10-CM | POA: Insufficient documentation

## 2022-03-21 DIAGNOSIS — D649 Anemia, unspecified: Secondary | ICD-10-CM | POA: Diagnosis not present

## 2022-03-21 DIAGNOSIS — F419 Anxiety disorder, unspecified: Secondary | ICD-10-CM

## 2022-03-21 DIAGNOSIS — F17200 Nicotine dependence, unspecified, uncomplicated: Secondary | ICD-10-CM | POA: Diagnosis not present

## 2022-03-21 DIAGNOSIS — Z1211 Encounter for screening for malignant neoplasm of colon: Secondary | ICD-10-CM

## 2022-03-21 DIAGNOSIS — K449 Diaphragmatic hernia without obstruction or gangrene: Secondary | ICD-10-CM | POA: Diagnosis not present

## 2022-03-21 DIAGNOSIS — F17210 Nicotine dependence, cigarettes, uncomplicated: Secondary | ICD-10-CM | POA: Insufficient documentation

## 2022-03-21 HISTORY — PX: COLONOSCOPY WITH PROPOFOL: SHX5780

## 2022-03-21 LAB — GLUCOSE, CAPILLARY: Glucose-Capillary: 184 mg/dL — ABNORMAL HIGH (ref 70–99)

## 2022-03-21 SURGERY — COLONOSCOPY WITH PROPOFOL
Anesthesia: General

## 2022-03-21 MED ORDER — LACTATED RINGERS IV SOLN: INTRAVENOUS | Status: DC

## 2022-03-21 MED ORDER — LIDOCAINE HCL (CARDIAC) PF 100 MG/5ML IV SOSY
PREFILLED_SYRINGE | INTRAVENOUS | Status: DC | PRN
  Administered 2022-03-21: 50 mg via INTRATRACHEAL

## 2022-03-21 MED ORDER — PHENYLEPHRINE 80 MCG/ML (10ML) SYRINGE FOR IV PUSH (FOR BLOOD PRESSURE SUPPORT)
PREFILLED_SYRINGE | INTRAVENOUS | Status: DC | PRN
  Administered 2022-03-21: 160 ug via INTRAVENOUS

## 2022-03-21 MED ORDER — PROPOFOL 500 MG/50ML IV EMUL
INTRAVENOUS | Status: DC | PRN
  Administered 2022-03-21: 200 ug/kg/min via INTRAVENOUS

## 2022-03-21 MED ORDER — PROPOFOL 10 MG/ML IV BOLUS
INTRAVENOUS | Status: DC | PRN
  Administered 2022-03-21: 100 mg via INTRAVENOUS

## 2022-03-21 NOTE — Transfer of Care (Signed)
Immediate Anesthesia Transfer of Care Note  Patient: April Robles  Procedure(s) Performed: COLONOSCOPY WITH PROPOFOL  Patient Location: Short Stay  Anesthesia Type:General  Level of Consciousness: awake, alert , oriented, and patient cooperative  Airway & Oxygen Therapy: Patient Spontanous Breathing  Post-op Assessment: Report given to RN, Post -op Vital signs reviewed and stable, and Patient moving all extremities  Post vital signs: Reviewed and stable  Last Vitals:  Vitals Value Taken Time  BP 101/57 03/21/22 0902  Temp 36.7 C 03/21/22 0902  Pulse 90 03/21/22 0902  Resp 18 03/21/22 0902  SpO2 98 % 03/21/22 0902    Last Pain:  Vitals:   03/21/22 0902  TempSrc: Oral  PainSc: 0-No pain      Patients Stated Pain Goal: 5 ([phone] [id number])  Complications: No notable events documented.

## 2022-03-21 NOTE — H&P (Signed)
Primary Care Physician:  Redmond School, MD Primary Gastroenterologist:  Dr. Abbey Chatters  Pre-Procedure History & Physical: HPI:  April Robles is a 62 y.o. female is here for a colonoscopy for colon cancer screening purposes.  Patient denies any family history of colorectal cancer.  No melena or hematochezia.  No abdominal pain or unintentional weight loss.  No change in bowel habits.  Overall feels well from a GI standpoint.  Past Medical History:  Diagnosis Date   Automatic implantable cardiac defibrillator in situ 05/05/2011   2nd (since 2010)   Automatic implantable cardiac defibrillator in situ    Patient states it was removed   Fibromyalgia    Hiatal hernia    History of shingles    Hypertension    ICD (implantable cardiac defibrillator) in place    Pacemaker 05/05/2011   Panic attack    Ulcer disease    remote in 20s    Past Surgical History:  Procedure Laterality Date   CHOLECYSTECTOMY  1997   COLONOSCOPY  11/17/2011   [id number] sessile polyps ranging between 3-50m in size were found in the ascending colon, at the cecum, in the descending colon, and sigmoid colon/(tubular adenoma.hyperplastic)Mild diverticulosis/Moderate sized internal hemorrhoids   defibrillator/pacemaker  05/2011   replaced old one   ESOPHAGOGASTRODUODENOSCOPY  11/17/2011   SLF:Non-erosive gastritis (inflammation) was found/Negative H pylori. The mucosa of the esophagus appeared normal, Benign SB bx   GIVENS CAPSULE STUDY  12/23/2011   Fields: small bowel AVMs   KNEE ARTHROSCOPY     both knees   TUBAL LIGATION  1997    Prior to Admission medications   Medication Sig Start Date End Date Taking? Authorizing Provider  amLODipine (NORVASC) 10 MG tablet Take 10 mg by mouth at bedtime. 09/21/18  Yes [provider]  busPIRone (BUSPAR) 5 MG tablet Take 5 mg by mouth 2 (two) times daily. 09/21/18  Yes [provider]  diclofenac Sodium (VOLTAREN) 1 % GEL Apply 2 g topically daily as needed  (pain).   Yes [provider]  HYDROcodone-acetaminophen (NORCO) 7.5-325 MG per tablet Take 1 tablet by mouth in the morning, at noon, in the evening, and at bedtime. 11/01/11  Yes [provider]  losartan (COZAAR) 25 MG tablet Take 25 mg by mouth daily. 09/21/18  Yes [provider]  magnesium oxide (MAG-OX) 400 (240 Mg) MG tablet Take 400 mg by mouth daily.   Yes [provider]  meloxicam (MOBIC) 15 MG tablet Take 15 mg by mouth daily.   Yes [provider]  omeprazole (PRILOSEC) 20 MG capsule Take 20 mg by mouth daily.  11/01/11  Yes [provider]  potassium chloride SA (KLOR-CON M) 20 MEQ tablet Take 20 mEq by mouth daily. 02/25/22  Yes [provider]  valACYclovir (VALTREX) 1000 MG tablet Take 1,000 mg by mouth 2 (two) times daily as needed (shingles).   Yes [provider]  VENTOLIN HFA 108 (90 Base) MCG/ACT inhaler Inhale 1-2 puffs into the lungs every 6 (six) hours as needed for shortness of breath or wheezing. 09/21/18  Yes [provider]  Vitamin D-Vitamin K (K2 PLUS D3) [phone] MCG-UNIT TABS Take 1 tablet by mouth daily.   Yes [provider]  hydrochlorothiazide (HYDRODIURIL) 25 MG tablet Take 25 mg by mouth daily as needed (fluid).    [provider]    Allergies as of 02/21/2022 - Review Complete 02/20/2022  Allergen Reaction Noted   Duloxetine  01/29/2020   Cymbalta [duloxetine  hcl] Palpitations 11/05/2011    Family History  Problem Relation Age of Onset   Alcohol abuse Father    Coronary artery disease Father    Diabetes Father    Stroke Mother    Hypertension Sister    Colon cancer Neg Hx     Social History   Socioeconomic History   Marital status: Single    Spouse name: Not on file   Number of children: 3   Years of education: 12   Highest education level: Not on file  Occupational History   Occupation: disabled  Tobacco Use   Smoking status: Every Day     Packs/day: 0.20    Years: 20.00    Total pack years: 4.00    Types: Cigarettes   Smokeless tobacco: Not on file  Substance and Sexual Activity   Alcohol use: No   Drug use: No   Sexual activity: Not on file  Other Topics Concern   Not on file  Social History Narrative   Lives in one story    Right handed   Social Determinants of Health   Financial Resource Strain: Not on file  Food Insecurity: Not on file  Transportation Needs: Not on file  Physical Activity: Not on file  Stress: Not on file  Social Connections: Not on file  Intimate Partner Violence: Not on file    Review of Systems: See HPI, otherwise negative ROS  Physical Exam: Vital signs in last 24 hours:     General:   Alert,  Well-developed, well-nourished, pleasant and cooperative in NAD Head:  Normocephalic and atraumatic. Eyes:  Sclera clear, no icterus.   Conjunctiva pink. Ears:  Normal auditory acuity. Nose:  No deformity, discharge,  or lesions. Msk:  Symmetrical without gross deformities. Normal posture. Extremities:  Without clubbing or edema. Neurologic:  Alert and  oriented x4;  grossly normal neurologically. Skin:  Intact without significant lesions or rashes. Psych:  Alert and cooperative. Normal mood and affect.  Impression/Plan: April Robles is here for a colonoscopy to be performed for colon cancer screening purposes.  The risks of the procedure including infection, bleed, or perforation as well as benefits, limitations, alternatives and imponderables have been reviewed with the patient. Questions have been answered. All parties agreeable.

## 2022-03-21 NOTE — Op Note (Signed)
[facility] Patient Name: April Robles Procedure Date: 03/21/2022 8:33 AM MRN: [id number] Date of Birth: January 23, 1961 Attending MD: Elon Alas. Abbey Chatters , [place], [id number] CSN: [id number] Age: 62 Admit Type: Outpatient Procedure:                Colonoscopy Indications:              Screening for colorectal malignant neoplasm Providers:                Elon Alas. Abbey Chatters, DO, Crystal Page, Randa Spike, Technician Referring MD:              Medicines:                See the Anesthesia note for documentation of the                            administered medications Complications:            No immediate complications. Estimated Blood Loss:     Estimated blood loss: none. Procedure:                Pre-Anesthesia Assessment:                           - The anesthesia plan was to use monitored                            anesthesia care (MAC).                           After obtaining informed consent, the colonoscope                            was passed under direct vision. Throughout the                            procedure, the patient's blood pressure, pulse, and                            oxygen saturations were monitored continuously. The                            [id number] scope was introduced through                            the anus and advanced to the the cecum, identified                            by appendiceal orifice and ileocecal valve. The                            colonoscopy was performed without difficulty. The                            patient tolerated the procedure well. The quality  of the bowel preparation was evaluated using the                            BBPS [facility] Bowel Preparation Scale) with scores                            of: Right Colon = 3, Transverse Colon = 3 and Left                            Colon = 3 (entire mucosa seen well with no residual                            staining,  small fragments of stool or opaque                            liquid). The total BBPS score equals 9. Scope In: 8:41:10 AM Scope Out: 8:57:21 AM Scope Withdrawal Time: 0 hours 8 minutes 40 seconds  Total Procedure Duration: 0 hours 16 minutes 11 seconds  Findings:      The perianal and digital rectal examinations were normal.      Non-bleeding internal hemorrhoids were found during endoscopy.      Multiple medium-mouthed diverticula were found in the sigmoid colon and       descending colon.      The exam was otherwise without abnormality. Impression:               - Non-bleeding internal hemorrhoids.                           - Diverticulosis in the sigmoid colon and in the                            descending colon.                           - The examination was otherwise normal.                           - No specimens collected. Moderate Sedation:      Per Anesthesia Care Recommendation:           - Patient has a contact number available for                            emergencies. The signs and symptoms of potential                            delayed complications were discussed with the                            patient. Return to normal activities tomorrow.                            Written discharge instructions were provided to the  patient.                           - Resume previous diet.                           - Continue present medications.                           - Repeat colonoscopy in 10 years for screening                            purposes.                           - Return to GI clinic PRN. Procedure Code(s):        --- Professional ---                           [id number], Colorectal cancer screening; colonoscopy on                            individual not meeting criteria for high risk Diagnosis Code(s):        --- Professional ---                           Z12.11, Encounter for screening for malignant                             neoplasm of colon                           K64.8, Other hemorrhoids                           K57.30, Diverticulosis of large intestine without                            perforation or abscess without bleeding CPT copyright 2022 American [employer] Association. All rights reserved. The codes documented in this report are preliminary and upon coder review may  be revised to meet current compliance requirements. Elon Alas. Abbey Chatters, DO [place] Abbey Chatters, DO 03/21/2022 9:00:09 AM This report has been signed electronically. Number of Addenda: 0

## 2022-03-21 NOTE — Anesthesia Preprocedure Evaluation (Signed)
Anesthesia Evaluation  Patient identified by MRN, date of birth, ID band Patient awake    Reviewed: Allergy & Precautions, H&P , NPO status , Patient's Chart, lab work & pertinent test results, reviewed documented beta blocker date and time   Airway Mallampati: II  TM Distance: >3 FB Neck ROM: full    Dental no notable dental hx.    Pulmonary neg pulmonary ROS, Current Smoker and Patient abstained from smoking.   Pulmonary exam normal breath sounds clear to auscultation       Cardiovascular Exercise Tolerance: Good hypertension, (-) pacemaker Rhythm:regular Rate:Normal     Neuro/Psych   Anxiety      Neuromuscular disease negative neurological ROS  negative psych ROS   GI/Hepatic negative GI ROS, Neg liver ROS, hiatal hernia,GERD  ,,  Endo/Other  negative endocrine ROS    Renal/GU negative Renal ROS  negative genitourinary   Musculoskeletal   Abdominal   Peds  Hematology negative hematology ROS (+) Blood dyscrasia, anemia   Anesthesia Other Findings   Reproductive/Obstetrics negative OB ROS                             Anesthesia Physical Anesthesia Plan  ASA: 3  Anesthesia Plan: General   Post-op Pain Management:    Induction:   PONV Risk Score and Plan: Propofol infusion  Airway Management Planned:   Additional Equipment:   Intra-op Plan:   Post-operative Plan:   Informed Consent: I have reviewed the patients History and Physical, chart, labs and discussed the procedure including the risks, benefits and alternatives for the proposed anesthesia with the patient or authorized representative who has indicated his/her understanding and acceptance.     Dental Advisory Given  Plan Discussed with: CRNA  Anesthesia Plan Comments:        Anesthesia Quick Evaluation

## 2022-03-21 NOTE — Progress Notes (Signed)
  March 21, 2022  Patient: April Robles  Date of Birth: 04/22/1960  Date of Visit: 03/21/2022    To Whom It May Concern:  Aanika Tauscher was seen and treated in our emergency department or urgent care center on 03/21/2022. Averil Netzer  may return to school on 03/22/2022 .  Sincerely,  Kandis Mannan, RN

## 2022-03-21 NOTE — Discharge Instructions (Signed)
°  Colonoscopy °Discharge Instructions ° °Read the instructions outlined below and refer to this sheet in the next few weeks. These discharge instructions provide you with general information on caring for yourself after you leave the hospital. Your doctor may also give you specific instructions. While your treatment has been planned according to the most current medical practices available, unavoidable complications occasionally occur.  ° °ACTIVITY °You may resume your regular activity, but move at a slower pace for the next 24 hours.  °Take frequent rest periods for the next 24 hours.  °Walking will help get rid of the air and reduce the bloated feeling in your belly (abdomen).  °No driving for 24 hours (because of the medicine (anesthesia) used during the test).   °Do not sign any important legal documents or operate any machinery for 24 hours (because of the anesthesia used during the test).  °NUTRITION °Drink plenty of fluids.  °You may resume your normal diet as instructed by your doctor.  °Begin with a light meal and progress to your normal diet. Heavy or fried foods are harder to digest and may make you feel sick to your stomach (nauseated).  °Avoid alcoholic beverages for 24 hours or as instructed.  °MEDICATIONS °You may resume your normal medications unless your doctor tells you otherwise.  °WHAT YOU CAN EXPECT TODAY °Some feelings of bloating in the abdomen.  °Passage of more gas than usual.  °Spotting of blood in your stool or on the toilet paper.  °IF YOU HAD POLYPS REMOVED DURING THE COLONOSCOPY: °No aspirin products for 7 days or as instructed.  °No alcohol for 7 days or as instructed.  °Eat a soft diet for the next 24 hours.  °FINDING OUT THE RESULTS OF YOUR TEST °Not all test results are available during your visit. If your test results are not back during the visit, make an appointment with your caregiver to find out the results. Do not assume everything is normal if you have not heard from your  caregiver or the medical facility. It is important for you to follow up on all of your test results.  °SEEK IMMEDIATE MEDICAL ATTENTION IF: °You have more than a spotting of blood in your stool.  °Your belly is swollen (abdominal distention).  °You are nauseated or vomiting.  °You have a temperature over 101.  °You have abdominal pain or discomfort that is severe or gets worse throughout the day.  ° °Your colonoscopy was relatively unremarkable.  I did not find any polyps or evidence of colon cancer.  I recommend repeating colonoscopy in 10 years for colon cancer screening purposes.  You do have diverticulosis and internal hemorrhoids. I would recommend increasing fiber in your diet or adding OTC Benefiber/Metamucil. Be sure to drink at least 4 to 6 glasses of water daily. Follow-up with GI as needed. ° ° °I hope you have a great rest of your week! ° °Dosha Broshears K. Menna Abeln, D.O. °Gastroenterology and Hepatology °[facility] ° °

## 2022-03-22 NOTE — Anesthesia Postprocedure Evaluation (Signed)
Anesthesia Post Note  Patient: April Robles  Procedure(s) Performed: COLONOSCOPY WITH PROPOFOL  Patient location during evaluation: Phase II Anesthesia Type: General Level of consciousness: awake Pain management: pain level controlled Vital Signs Assessment: post-procedure vital signs reviewed and stable Respiratory status: spontaneous breathing and respiratory function stable Cardiovascular status: blood pressure returned to baseline and stable Postop Assessment: no headache and no apparent nausea or vomiting Anesthetic complications: no Comments: Late entry   No notable events documented.   Last Vitals:  Vitals:   03/21/22 0801 03/21/22 0902  BP: 129/82 (!) 101/57  Pulse: 94 90  Resp: (!) 27 18  Temp: 36.8 C 36.7 C  SpO2: 100% 98%    Last Pain:  Vitals:   03/21/22 0902  TempSrc: Oral  PainSc: 0-No pain                 Louann Sjogren

## 2022-03-27 ENCOUNTER — Encounter (HOSPITAL_COMMUNITY): Payer: Self-pay | Admitting: Internal Medicine

## 2022-04-01 DIAGNOSIS — G8929 Other chronic pain: Secondary | ICD-10-CM | POA: Diagnosis not present

## 2022-04-01 DIAGNOSIS — M19011 Primary osteoarthritis, right shoulder: Secondary | ICD-10-CM | POA: Diagnosis not present

## 2022-04-25 DIAGNOSIS — E1129 Type 2 diabetes mellitus with other diabetic kidney complication: Secondary | ICD-10-CM | POA: Diagnosis not present

## 2022-04-25 DIAGNOSIS — I129 Hypertensive chronic kidney disease with stage 1 through stage 4 chronic kidney disease, or unspecified chronic kidney disease: Secondary | ICD-10-CM | POA: Diagnosis not present

## 2022-04-25 DIAGNOSIS — M159 Polyosteoarthritis, unspecified: Secondary | ICD-10-CM | POA: Diagnosis not present

## 2022-04-25 DIAGNOSIS — I10 Essential (primary) hypertension: Secondary | ICD-10-CM | POA: Diagnosis not present

## 2022-05-01 DIAGNOSIS — I10 Essential (primary) hypertension: Secondary | ICD-10-CM | POA: Diagnosis not present

## 2022-05-29 DIAGNOSIS — M159 Polyosteoarthritis, unspecified: Secondary | ICD-10-CM | POA: Diagnosis not present

## 2022-05-29 DIAGNOSIS — E1129 Type 2 diabetes mellitus with other diabetic kidney complication: Secondary | ICD-10-CM | POA: Diagnosis not present

## 2022-05-29 DIAGNOSIS — I10 Essential (primary) hypertension: Secondary | ICD-10-CM | POA: Diagnosis not present

## 2022-05-29 DIAGNOSIS — I129 Hypertensive chronic kidney disease with stage 1 through stage 4 chronic kidney disease, or unspecified chronic kidney disease: Secondary | ICD-10-CM | POA: Diagnosis not present

## 2022-05-29 DIAGNOSIS — N1832 Chronic kidney disease, stage 3b: Secondary | ICD-10-CM | POA: Diagnosis not present

## 2022-06-20 DIAGNOSIS — N1832 Chronic kidney disease, stage 3b: Secondary | ICD-10-CM | POA: Diagnosis not present

## 2022-06-20 DIAGNOSIS — M159 Polyosteoarthritis, unspecified: Secondary | ICD-10-CM | POA: Diagnosis not present

## 2022-06-20 DIAGNOSIS — I10 Essential (primary) hypertension: Secondary | ICD-10-CM | POA: Diagnosis not present

## 2022-06-20 DIAGNOSIS — E1129 Type 2 diabetes mellitus with other diabetic kidney complication: Secondary | ICD-10-CM | POA: Diagnosis not present

## 2022-07-28 DIAGNOSIS — M159 Polyosteoarthritis, unspecified: Secondary | ICD-10-CM | POA: Diagnosis not present

## 2022-07-28 DIAGNOSIS — N1832 Chronic kidney disease, stage 3b: Secondary | ICD-10-CM | POA: Diagnosis not present

## 2022-07-28 DIAGNOSIS — I10 Essential (primary) hypertension: Secondary | ICD-10-CM | POA: Diagnosis not present

## 2022-07-28 DIAGNOSIS — G894 Chronic pain syndrome: Secondary | ICD-10-CM | POA: Diagnosis not present

## 2022-08-11 DIAGNOSIS — I10 Essential (primary) hypertension: Secondary | ICD-10-CM | POA: Diagnosis not present

## 2022-08-11 DIAGNOSIS — I2510 Atherosclerotic heart disease of native coronary artery without angina pectoris: Secondary | ICD-10-CM | POA: Diagnosis not present

## 2022-08-11 DIAGNOSIS — R109 Unspecified abdominal pain: Secondary | ICD-10-CM | POA: Diagnosis not present

## 2022-08-11 DIAGNOSIS — N189 Chronic kidney disease, unspecified: Secondary | ICD-10-CM | POA: Diagnosis not present

## 2022-08-11 DIAGNOSIS — K5730 Diverticulosis of large intestine without perforation or abscess without bleeding: Secondary | ICD-10-CM | POA: Diagnosis not present

## 2022-08-11 DIAGNOSIS — I700 Atherosclerosis of aorta: Secondary | ICD-10-CM | POA: Diagnosis not present

## 2022-08-11 DIAGNOSIS — R6889 Other general symptoms and signs: Secondary | ICD-10-CM | POA: Diagnosis not present

## 2022-08-11 DIAGNOSIS — R9431 Abnormal electrocardiogram [ECG] [EKG]: Secondary | ICD-10-CM | POA: Diagnosis not present

## 2022-08-11 DIAGNOSIS — Z743 Need for continuous supervision: Secondary | ICD-10-CM | POA: Diagnosis not present

## 2022-08-11 DIAGNOSIS — R1111 Vomiting without nausea: Secondary | ICD-10-CM | POA: Diagnosis not present

## 2022-08-11 DIAGNOSIS — M62838 Other muscle spasm: Secondary | ICD-10-CM | POA: Diagnosis not present

## 2022-08-11 DIAGNOSIS — F17200 Nicotine dependence, unspecified, uncomplicated: Secondary | ICD-10-CM | POA: Diagnosis not present

## 2022-08-11 DIAGNOSIS — E1122 Type 2 diabetes mellitus with diabetic chronic kidney disease: Secondary | ICD-10-CM | POA: Diagnosis not present

## 2022-08-11 DIAGNOSIS — R112 Nausea with vomiting, unspecified: Secondary | ICD-10-CM | POA: Diagnosis not present

## 2022-08-11 DIAGNOSIS — R1084 Generalized abdominal pain: Secondary | ICD-10-CM | POA: Diagnosis not present

## 2022-08-11 DIAGNOSIS — R531 Weakness: Secondary | ICD-10-CM | POA: Diagnosis not present

## 2022-08-11 DIAGNOSIS — Z79899 Other long term (current) drug therapy: Secondary | ICD-10-CM | POA: Diagnosis not present

## 2022-08-11 DIAGNOSIS — I129 Hypertensive chronic kidney disease with stage 1 through stage 4 chronic kidney disease, or unspecified chronic kidney disease: Secondary | ICD-10-CM | POA: Diagnosis not present

## 2022-08-11 DIAGNOSIS — Z7982 Long term (current) use of aspirin: Secondary | ICD-10-CM | POA: Diagnosis not present

## 2022-08-25 DIAGNOSIS — M159 Polyosteoarthritis, unspecified: Secondary | ICD-10-CM | POA: Diagnosis not present

## 2022-08-25 DIAGNOSIS — I129 Hypertensive chronic kidney disease with stage 1 through stage 4 chronic kidney disease, or unspecified chronic kidney disease: Secondary | ICD-10-CM | POA: Diagnosis not present

## 2022-08-25 DIAGNOSIS — I10 Essential (primary) hypertension: Secondary | ICD-10-CM | POA: Diagnosis not present

## 2022-08-25 DIAGNOSIS — E1129 Type 2 diabetes mellitus with other diabetic kidney complication: Secondary | ICD-10-CM | POA: Diagnosis not present

## 2022-08-25 DIAGNOSIS — N1832 Chronic kidney disease, stage 3b: Secondary | ICD-10-CM | POA: Diagnosis not present

## 2022-08-27 DIAGNOSIS — E1129 Type 2 diabetes mellitus with other diabetic kidney complication: Secondary | ICD-10-CM | POA: Diagnosis not present

## 2022-08-27 DIAGNOSIS — I129 Hypertensive chronic kidney disease with stage 1 through stage 4 chronic kidney disease, or unspecified chronic kidney disease: Secondary | ICD-10-CM | POA: Diagnosis not present

## 2022-08-27 DIAGNOSIS — E559 Vitamin D deficiency, unspecified: Secondary | ICD-10-CM | POA: Diagnosis not present

## 2022-08-27 DIAGNOSIS — I10 Essential (primary) hypertension: Secondary | ICD-10-CM | POA: Diagnosis not present

## 2022-09-16 DIAGNOSIS — R809 Proteinuria, unspecified: Secondary | ICD-10-CM | POA: Diagnosis not present

## 2022-09-16 DIAGNOSIS — N183 Chronic kidney disease, stage 3 unspecified: Secondary | ICD-10-CM | POA: Diagnosis not present

## 2022-09-16 DIAGNOSIS — D631 Anemia in chronic kidney disease: Secondary | ICD-10-CM | POA: Diagnosis not present

## 2022-09-16 DIAGNOSIS — N189 Chronic kidney disease, unspecified: Secondary | ICD-10-CM | POA: Diagnosis not present

## 2022-09-23 DIAGNOSIS — I129 Hypertensive chronic kidney disease with stage 1 through stage 4 chronic kidney disease, or unspecified chronic kidney disease: Secondary | ICD-10-CM | POA: Diagnosis not present

## 2022-09-23 DIAGNOSIS — E8721 Acute metabolic acidosis: Secondary | ICD-10-CM | POA: Diagnosis not present

## 2022-09-23 DIAGNOSIS — E1122 Type 2 diabetes mellitus with diabetic chronic kidney disease: Secondary | ICD-10-CM | POA: Diagnosis not present

## 2022-09-23 DIAGNOSIS — N1831 Chronic kidney disease, stage 3a: Secondary | ICD-10-CM | POA: Diagnosis not present

## 2022-09-25 DIAGNOSIS — M159 Polyosteoarthritis, unspecified: Secondary | ICD-10-CM | POA: Diagnosis not present

## 2022-09-25 DIAGNOSIS — E1129 Type 2 diabetes mellitus with other diabetic kidney complication: Secondary | ICD-10-CM | POA: Diagnosis not present

## 2022-09-25 DIAGNOSIS — I10 Essential (primary) hypertension: Secondary | ICD-10-CM | POA: Diagnosis not present

## 2022-09-25 DIAGNOSIS — G894 Chronic pain syndrome: Secondary | ICD-10-CM | POA: Diagnosis not present

## 2022-09-25 DIAGNOSIS — I129 Hypertensive chronic kidney disease with stage 1 through stage 4 chronic kidney disease, or unspecified chronic kidney disease: Secondary | ICD-10-CM | POA: Diagnosis not present

## 2022-09-25 DIAGNOSIS — G4762 Sleep related leg cramps: Secondary | ICD-10-CM | POA: Diagnosis not present

## 2022-09-25 DIAGNOSIS — N1832 Chronic kidney disease, stage 3b: Secondary | ICD-10-CM | POA: Diagnosis not present

## 2022-09-25 DIAGNOSIS — Z0001 Encounter for general adult medical examination with abnormal findings: Secondary | ICD-10-CM | POA: Diagnosis not present

## 2022-09-25 DIAGNOSIS — G2581 Restless legs syndrome: Secondary | ICD-10-CM | POA: Diagnosis not present

## 2022-10-24 DIAGNOSIS — I129 Hypertensive chronic kidney disease with stage 1 through stage 4 chronic kidney disease, or unspecified chronic kidney disease: Secondary | ICD-10-CM | POA: Diagnosis not present

## 2022-10-24 DIAGNOSIS — I10 Essential (primary) hypertension: Secondary | ICD-10-CM | POA: Diagnosis not present

## 2022-10-24 DIAGNOSIS — M159 Polyosteoarthritis, unspecified: Secondary | ICD-10-CM | POA: Diagnosis not present

## 2022-10-24 DIAGNOSIS — E1129 Type 2 diabetes mellitus with other diabetic kidney complication: Secondary | ICD-10-CM | POA: Diagnosis not present

## 2022-10-24 DIAGNOSIS — N1832 Chronic kidney disease, stage 3b: Secondary | ICD-10-CM | POA: Diagnosis not present

## 2022-11-21 DIAGNOSIS — E1129 Type 2 diabetes mellitus with other diabetic kidney complication: Secondary | ICD-10-CM | POA: Diagnosis not present

## 2022-11-21 DIAGNOSIS — N1832 Chronic kidney disease, stage 3b: Secondary | ICD-10-CM | POA: Diagnosis not present

## 2022-11-21 DIAGNOSIS — M159 Polyosteoarthritis, unspecified: Secondary | ICD-10-CM | POA: Diagnosis not present

## 2022-11-21 DIAGNOSIS — I10 Essential (primary) hypertension: Secondary | ICD-10-CM | POA: Diagnosis not present

## 2022-12-15 DIAGNOSIS — N1832 Chronic kidney disease, stage 3b: Secondary | ICD-10-CM | POA: Diagnosis not present

## 2022-12-15 DIAGNOSIS — N1831 Chronic kidney disease, stage 3a: Secondary | ICD-10-CM | POA: Diagnosis not present

## 2022-12-15 DIAGNOSIS — M159 Polyosteoarthritis, unspecified: Secondary | ICD-10-CM | POA: Diagnosis not present

## 2022-12-15 DIAGNOSIS — I129 Hypertensive chronic kidney disease with stage 1 through stage 4 chronic kidney disease, or unspecified chronic kidney disease: Secondary | ICD-10-CM | POA: Diagnosis not present

## 2022-12-15 DIAGNOSIS — G2581 Restless legs syndrome: Secondary | ICD-10-CM | POA: Diagnosis not present

## 2022-12-15 DIAGNOSIS — R809 Proteinuria, unspecified: Secondary | ICD-10-CM | POA: Diagnosis not present

## 2022-12-15 DIAGNOSIS — D649 Anemia, unspecified: Secondary | ICD-10-CM | POA: Diagnosis not present

## 2022-12-15 DIAGNOSIS — I10 Essential (primary) hypertension: Secondary | ICD-10-CM | POA: Diagnosis not present

## 2022-12-15 DIAGNOSIS — Z23 Encounter for immunization: Secondary | ICD-10-CM | POA: Diagnosis not present

## 2022-12-15 DIAGNOSIS — G894 Chronic pain syndrome: Secondary | ICD-10-CM | POA: Diagnosis not present

## 2022-12-15 DIAGNOSIS — N189 Chronic kidney disease, unspecified: Secondary | ICD-10-CM | POA: Diagnosis not present

## 2022-12-24 DIAGNOSIS — E1129 Type 2 diabetes mellitus with other diabetic kidney complication: Secondary | ICD-10-CM | POA: Diagnosis not present

## 2022-12-29 DIAGNOSIS — I129 Hypertensive chronic kidney disease with stage 1 through stage 4 chronic kidney disease, or unspecified chronic kidney disease: Secondary | ICD-10-CM | POA: Diagnosis not present

## 2022-12-29 DIAGNOSIS — E1122 Type 2 diabetes mellitus with diabetic chronic kidney disease: Secondary | ICD-10-CM | POA: Diagnosis not present

## 2022-12-29 DIAGNOSIS — N2581 Secondary hyperparathyroidism of renal origin: Secondary | ICD-10-CM | POA: Diagnosis not present

## 2022-12-29 DIAGNOSIS — N1831 Chronic kidney disease, stage 3a: Secondary | ICD-10-CM | POA: Diagnosis not present

## 2023-01-02 ENCOUNTER — Other Ambulatory Visit (HOSPITAL_COMMUNITY): Payer: Self-pay | Admitting: Nephrology

## 2023-01-02 DIAGNOSIS — D649 Anemia, unspecified: Secondary | ICD-10-CM

## 2023-01-02 DIAGNOSIS — I129 Hypertensive chronic kidney disease with stage 1 through stage 4 chronic kidney disease, or unspecified chronic kidney disease: Secondary | ICD-10-CM

## 2023-01-02 DIAGNOSIS — N1831 Chronic kidney disease, stage 3a: Secondary | ICD-10-CM

## 2023-01-15 ENCOUNTER — Ambulatory Visit (HOSPITAL_COMMUNITY)
Admission: RE | Admit: 2023-01-15 | Discharge: 2023-01-15 | Disposition: A | Discharge status: Home / Self Care | Payer: 59 | Source: Ambulatory Visit | Attending: Nephrology | Admitting: Nephrology

## 2023-01-15 DIAGNOSIS — N200 Calculus of kidney: Secondary | ICD-10-CM | POA: Diagnosis not present

## 2023-01-15 DIAGNOSIS — I129 Hypertensive chronic kidney disease with stage 1 through stage 4 chronic kidney disease, or unspecified chronic kidney disease: Secondary | ICD-10-CM | POA: Diagnosis not present

## 2023-01-15 DIAGNOSIS — D649 Anemia, unspecified: Secondary | ICD-10-CM | POA: Insufficient documentation

## 2023-01-15 DIAGNOSIS — N189 Chronic kidney disease, unspecified: Secondary | ICD-10-CM | POA: Diagnosis not present

## 2023-01-15 DIAGNOSIS — N1831 Chronic kidney disease, stage 3a: Secondary | ICD-10-CM | POA: Insufficient documentation

## 2023-01-21 DIAGNOSIS — I129 Hypertensive chronic kidney disease with stage 1 through stage 4 chronic kidney disease, or unspecified chronic kidney disease: Secondary | ICD-10-CM | POA: Diagnosis not present

## 2023-01-21 DIAGNOSIS — I10 Essential (primary) hypertension: Secondary | ICD-10-CM | POA: Diagnosis not present

## 2023-01-21 DIAGNOSIS — M159 Polyosteoarthritis, unspecified: Secondary | ICD-10-CM | POA: Diagnosis not present

## 2023-01-21 DIAGNOSIS — N1832 Chronic kidney disease, stage 3b: Secondary | ICD-10-CM | POA: Diagnosis not present

## 2023-01-21 DIAGNOSIS — E1129 Type 2 diabetes mellitus with other diabetic kidney complication: Secondary | ICD-10-CM | POA: Diagnosis not present

## 2023-01-21 DIAGNOSIS — G894 Chronic pain syndrome: Secondary | ICD-10-CM | POA: Diagnosis not present

## 2023-02-20 DIAGNOSIS — G894 Chronic pain syndrome: Secondary | ICD-10-CM | POA: Diagnosis not present

## 2023-02-20 DIAGNOSIS — I10 Essential (primary) hypertension: Secondary | ICD-10-CM | POA: Diagnosis not present

## 2023-02-20 DIAGNOSIS — N1832 Chronic kidney disease, stage 3b: Secondary | ICD-10-CM | POA: Diagnosis not present

## 2023-03-23 DIAGNOSIS — I129 Hypertensive chronic kidney disease with stage 1 through stage 4 chronic kidney disease, or unspecified chronic kidney disease: Secondary | ICD-10-CM | POA: Diagnosis not present

## 2023-03-23 DIAGNOSIS — N1832 Chronic kidney disease, stage 3b: Secondary | ICD-10-CM | POA: Diagnosis not present

## 2023-03-23 DIAGNOSIS — I10 Essential (primary) hypertension: Secondary | ICD-10-CM | POA: Diagnosis not present

## 2023-03-23 DIAGNOSIS — E1129 Type 2 diabetes mellitus with other diabetic kidney complication: Secondary | ICD-10-CM | POA: Diagnosis not present

## 2023-03-23 DIAGNOSIS — M159 Polyosteoarthritis, unspecified: Secondary | ICD-10-CM | POA: Diagnosis not present

## 2023-04-16 DIAGNOSIS — N1831 Chronic kidney disease, stage 3a: Secondary | ICD-10-CM | POA: Diagnosis not present

## 2023-04-16 DIAGNOSIS — D631 Anemia in chronic kidney disease: Secondary | ICD-10-CM | POA: Diagnosis not present

## 2023-04-16 DIAGNOSIS — N189 Chronic kidney disease, unspecified: Secondary | ICD-10-CM | POA: Diagnosis not present

## 2023-04-16 DIAGNOSIS — R809 Proteinuria, unspecified: Secondary | ICD-10-CM | POA: Diagnosis not present

## 2023-04-20 DIAGNOSIS — N1832 Chronic kidney disease, stage 3b: Secondary | ICD-10-CM | POA: Diagnosis not present

## 2023-04-20 DIAGNOSIS — I129 Hypertensive chronic kidney disease with stage 1 through stage 4 chronic kidney disease, or unspecified chronic kidney disease: Secondary | ICD-10-CM | POA: Diagnosis not present

## 2023-04-20 DIAGNOSIS — M159 Polyosteoarthritis, unspecified: Secondary | ICD-10-CM | POA: Diagnosis not present

## 2023-04-20 DIAGNOSIS — I10 Essential (primary) hypertension: Secondary | ICD-10-CM | POA: Diagnosis not present

## 2023-04-20 DIAGNOSIS — E1129 Type 2 diabetes mellitus with other diabetic kidney complication: Secondary | ICD-10-CM | POA: Diagnosis not present

## 2023-04-22 DIAGNOSIS — I129 Hypertensive chronic kidney disease with stage 1 through stage 4 chronic kidney disease, or unspecified chronic kidney disease: Secondary | ICD-10-CM | POA: Diagnosis not present

## 2023-04-22 DIAGNOSIS — E1122 Type 2 diabetes mellitus with diabetic chronic kidney disease: Secondary | ICD-10-CM | POA: Diagnosis not present

## 2023-04-22 DIAGNOSIS — N2581 Secondary hyperparathyroidism of renal origin: Secondary | ICD-10-CM | POA: Diagnosis not present

## 2023-04-22 DIAGNOSIS — N1831 Chronic kidney disease, stage 3a: Secondary | ICD-10-CM | POA: Diagnosis not present

## 2023-05-21 DIAGNOSIS — E1129 Type 2 diabetes mellitus with other diabetic kidney complication: Secondary | ICD-10-CM | POA: Diagnosis not present

## 2023-05-21 DIAGNOSIS — I10 Essential (primary) hypertension: Secondary | ICD-10-CM | POA: Diagnosis not present

## 2023-05-21 DIAGNOSIS — G894 Chronic pain syndrome: Secondary | ICD-10-CM | POA: Diagnosis not present

## 2023-05-21 DIAGNOSIS — M150 Primary generalized (osteo)arthritis: Secondary | ICD-10-CM | POA: Diagnosis not present

## 2023-06-18 DIAGNOSIS — E1129 Type 2 diabetes mellitus with other diabetic kidney complication: Secondary | ICD-10-CM | POA: Diagnosis not present

## 2023-06-18 DIAGNOSIS — I129 Hypertensive chronic kidney disease with stage 1 through stage 4 chronic kidney disease, or unspecified chronic kidney disease: Secondary | ICD-10-CM | POA: Diagnosis not present

## 2023-06-18 DIAGNOSIS — I10 Essential (primary) hypertension: Secondary | ICD-10-CM | POA: Diagnosis not present

## 2023-06-18 DIAGNOSIS — M159 Polyosteoarthritis, unspecified: Secondary | ICD-10-CM | POA: Diagnosis not present

## 2023-06-18 DIAGNOSIS — H539 Unspecified visual disturbance: Secondary | ICD-10-CM | POA: Diagnosis not present

## 2023-06-18 DIAGNOSIS — N1832 Chronic kidney disease, stage 3b: Secondary | ICD-10-CM | POA: Diagnosis not present

## 2023-06-18 DIAGNOSIS — M150 Primary generalized (osteo)arthritis: Secondary | ICD-10-CM | POA: Diagnosis not present

## 2023-07-17 DIAGNOSIS — H539 Unspecified visual disturbance: Secondary | ICD-10-CM | POA: Diagnosis not present

## 2023-07-17 DIAGNOSIS — M150 Primary generalized (osteo)arthritis: Secondary | ICD-10-CM | POA: Diagnosis not present

## 2023-07-17 DIAGNOSIS — N1832 Chronic kidney disease, stage 3b: Secondary | ICD-10-CM | POA: Diagnosis not present

## 2023-07-17 DIAGNOSIS — E1129 Type 2 diabetes mellitus with other diabetic kidney complication: Secondary | ICD-10-CM | POA: Diagnosis not present

## 2023-07-17 DIAGNOSIS — G8929 Other chronic pain: Secondary | ICD-10-CM | POA: Diagnosis not present

## 2023-08-14 DIAGNOSIS — M1990 Unspecified osteoarthritis, unspecified site: Secondary | ICD-10-CM | POA: Diagnosis not present

## 2023-08-14 DIAGNOSIS — R002 Palpitations: Secondary | ICD-10-CM | POA: Diagnosis not present

## 2023-08-14 DIAGNOSIS — E1129 Type 2 diabetes mellitus with other diabetic kidney complication: Secondary | ICD-10-CM | POA: Diagnosis not present

## 2023-08-14 DIAGNOSIS — I2510 Atherosclerotic heart disease of native coronary artery without angina pectoris: Secondary | ICD-10-CM | POA: Diagnosis not present

## 2023-08-14 DIAGNOSIS — Z720 Tobacco use: Secondary | ICD-10-CM | POA: Diagnosis not present

## 2023-08-14 DIAGNOSIS — M150 Primary generalized (osteo)arthritis: Secondary | ICD-10-CM | POA: Diagnosis not present

## 2023-08-14 DIAGNOSIS — R0602 Shortness of breath: Secondary | ICD-10-CM | POA: Diagnosis not present

## 2023-08-14 DIAGNOSIS — R000 Tachycardia, unspecified: Secondary | ICD-10-CM | POA: Diagnosis not present

## 2023-08-14 DIAGNOSIS — N1832 Chronic kidney disease, stage 3b: Secondary | ICD-10-CM | POA: Diagnosis not present

## 2023-08-14 DIAGNOSIS — I129 Hypertensive chronic kidney disease with stage 1 through stage 4 chronic kidney disease, or unspecified chronic kidney disease: Secondary | ICD-10-CM | POA: Diagnosis not present

## 2023-08-14 DIAGNOSIS — E119 Type 2 diabetes mellitus without complications: Secondary | ICD-10-CM | POA: Diagnosis not present

## 2023-08-14 DIAGNOSIS — G894 Chronic pain syndrome: Secondary | ICD-10-CM | POA: Diagnosis not present

## 2023-08-14 DIAGNOSIS — R0789 Other chest pain: Secondary | ICD-10-CM | POA: Diagnosis not present

## 2023-08-14 DIAGNOSIS — I10 Essential (primary) hypertension: Secondary | ICD-10-CM | POA: Diagnosis not present

## 2023-09-28 DIAGNOSIS — N1832 Chronic kidney disease, stage 3b: Secondary | ICD-10-CM | POA: Diagnosis not present

## 2023-09-28 DIAGNOSIS — D518 Other vitamin B12 deficiency anemias: Secondary | ICD-10-CM | POA: Diagnosis not present

## 2023-09-28 DIAGNOSIS — E559 Vitamin D deficiency, unspecified: Secondary | ICD-10-CM | POA: Diagnosis not present

## 2023-09-28 DIAGNOSIS — M150 Primary generalized (osteo)arthritis: Secondary | ICD-10-CM | POA: Diagnosis not present

## 2023-09-28 DIAGNOSIS — G9332 Myalgic encephalomyelitis/chronic fatigue syndrome: Secondary | ICD-10-CM | POA: Diagnosis not present

## 2023-09-28 DIAGNOSIS — M159 Polyosteoarthritis, unspecified: Secondary | ICD-10-CM | POA: Diagnosis not present

## 2023-09-28 DIAGNOSIS — G894 Chronic pain syndrome: Secondary | ICD-10-CM | POA: Diagnosis not present

## 2023-09-28 DIAGNOSIS — E1129 Type 2 diabetes mellitus with other diabetic kidney complication: Secondary | ICD-10-CM | POA: Diagnosis not present

## 2023-09-28 DIAGNOSIS — I10 Essential (primary) hypertension: Secondary | ICD-10-CM | POA: Diagnosis not present

## 2023-09-28 DIAGNOSIS — I129 Hypertensive chronic kidney disease with stage 1 through stage 4 chronic kidney disease, or unspecified chronic kidney disease: Secondary | ICD-10-CM | POA: Diagnosis not present

## 2023-09-28 DIAGNOSIS — Z0001 Encounter for general adult medical examination with abnormal findings: Secondary | ICD-10-CM | POA: Diagnosis not present

## 2023-10-22 DIAGNOSIS — N2581 Secondary hyperparathyroidism of renal origin: Secondary | ICD-10-CM | POA: Diagnosis not present

## 2023-10-22 DIAGNOSIS — E1122 Type 2 diabetes mellitus with diabetic chronic kidney disease: Secondary | ICD-10-CM | POA: Diagnosis not present

## 2023-10-22 DIAGNOSIS — I129 Hypertensive chronic kidney disease with stage 1 through stage 4 chronic kidney disease, or unspecified chronic kidney disease: Secondary | ICD-10-CM | POA: Diagnosis not present

## 2023-11-16 DIAGNOSIS — I10 Essential (primary) hypertension: Secondary | ICD-10-CM | POA: Diagnosis not present

## 2023-11-16 DIAGNOSIS — J4551 Severe persistent asthma with (acute) exacerbation: Secondary | ICD-10-CM | POA: Diagnosis not present

## 2023-11-16 DIAGNOSIS — E782 Mixed hyperlipidemia: Secondary | ICD-10-CM | POA: Diagnosis not present

## 2023-11-16 DIAGNOSIS — E1143 Type 2 diabetes mellitus with diabetic autonomic (poly)neuropathy: Secondary | ICD-10-CM | POA: Diagnosis not present

## 2023-11-16 DIAGNOSIS — M179 Osteoarthritis of knee, unspecified: Secondary | ICD-10-CM | POA: Diagnosis not present

## 2023-12-01 DIAGNOSIS — M170 Bilateral primary osteoarthritis of knee: Secondary | ICD-10-CM | POA: Diagnosis not present

## 2023-12-01 DIAGNOSIS — Z79899 Other long term (current) drug therapy: Secondary | ICD-10-CM | POA: Diagnosis not present

## 2023-12-01 DIAGNOSIS — Z1159 Encounter for screening for other viral diseases: Secondary | ICD-10-CM | POA: Diagnosis not present

## 2023-12-01 DIAGNOSIS — R7303 Prediabetes: Secondary | ICD-10-CM | POA: Diagnosis not present

## 2023-12-01 DIAGNOSIS — Z87898 Personal history of other specified conditions: Secondary | ICD-10-CM | POA: Diagnosis not present

## 2023-12-01 DIAGNOSIS — M160 Bilateral primary osteoarthritis of hip: Secondary | ICD-10-CM | POA: Diagnosis not present

## 2023-12-01 DIAGNOSIS — Z0000 Encounter for general adult medical examination without abnormal findings: Secondary | ICD-10-CM | POA: Diagnosis not present

## 2023-12-01 DIAGNOSIS — E559 Vitamin D deficiency, unspecified: Secondary | ICD-10-CM | POA: Diagnosis not present

## 2023-12-01 DIAGNOSIS — I10 Essential (primary) hypertension: Secondary | ICD-10-CM | POA: Diagnosis not present
